# Patient Record
Sex: Female | Born: 1990 | Race: White | Hispanic: No | Marital: Married | Smoking: Never smoker
Health system: Southern US, Community
[De-identification: ages and names within clinical notes are randomized; demographics above are authoritative.]

## PROBLEM LIST (undated history)

## (undated) ENCOUNTER — Inpatient Hospital Stay (HOSPITAL_COMMUNITY): Payer: Self-pay

## (undated) DIAGNOSIS — N390 Urinary tract infection, site not specified: Secondary | ICD-10-CM

## (undated) DIAGNOSIS — Z789 Other specified health status: Secondary | ICD-10-CM

## (undated) HISTORY — PX: TONSILLECTOMY: SUR1361

---

## 2013-05-23 ENCOUNTER — Emergency Department (HOSPITAL_COMMUNITY)
Admission: EM | Admit: 2013-05-23 | Discharge: 2013-05-23 | Disposition: A | Discharge status: Home / Self Care | Payer: BC Managed Care – PPO | Attending: Emergency Medicine | Admitting: Emergency Medicine

## 2013-05-23 ENCOUNTER — Emergency Department (HOSPITAL_COMMUNITY): Payer: BC Managed Care – PPO

## 2013-05-23 ENCOUNTER — Encounter (HOSPITAL_COMMUNITY): Payer: Self-pay | Admitting: Emergency Medicine

## 2013-05-23 DIAGNOSIS — Z3202 Encounter for pregnancy test, result negative: Secondary | ICD-10-CM | POA: Insufficient documentation

## 2013-05-23 DIAGNOSIS — Z79899 Other long term (current) drug therapy: Secondary | ICD-10-CM | POA: Insufficient documentation

## 2013-05-23 DIAGNOSIS — N390 Urinary tract infection, site not specified: Secondary | ICD-10-CM | POA: Insufficient documentation

## 2013-05-23 LAB — BASIC METABOLIC PANEL
BUN: 9 mg/dL (ref 6–23)
CO2: 27 mEq/L (ref 19–32)
Calcium: 9.6 mg/dL (ref 8.4–10.5)
Chloride: 101 mEq/L (ref 96–112)
Creatinine, Ser: 0.99 mg/dL (ref 0.50–1.10)
GFR calc Af Amer: >90 mL/min (ref >90)
GFR calc non Af Amer: 79 mL/min — ABNORMAL LOW (ref >90)
Glucose, Bld: 107 mg/dL — ABNORMAL HIGH (ref 70–99)
Potassium: 3.6 mEq/L (ref 3.5–5.1)
Sodium: 136 mEq/L (ref 135–145)

## 2013-05-23 LAB — CBC WITH DIFFERENTIAL/PLATELET
Basophils Absolute: 0 10*3/uL (ref 0.0–0.1)
Basophils Relative: 1 % (ref 0–1)
Eosinophils Absolute: 0 10*3/uL (ref 0.0–0.7)
Eosinophils Relative: 1 % (ref 0–5)
HCT: 42.5 % (ref 36.0–46.0)
Hemoglobin: 14.9 g/dL (ref 12.0–15.0)
Lymphocytes Relative: 40 % (ref 12–46)
Lymphs Abs: 2.2 10*3/uL (ref 0.7–4.0)
MCH: 33.8 pg (ref 26.0–34.0)
MCHC: 35.1 g/dL (ref 30.0–36.0)
MCV: 96.4 fL (ref 78.0–100.0)
Monocytes Absolute: 0.4 10*3/uL (ref 0.1–1.0)
Monocytes Relative: 7 % (ref 3–12)
Neutro Abs: 2.9 10*3/uL (ref 1.7–7.7)
Neutrophils Relative %: 51 % (ref 43–77)
Platelets: 241 10*3/uL (ref 150–400)
RBC: 4.41 MIL/uL (ref 3.87–5.11)
RDW: 11.6 % (ref 11.5–15.5)
WBC: 5.6 10*3/uL (ref 4.0–10.5)

## 2013-05-23 LAB — URINALYSIS, ROUTINE W REFLEX MICROSCOPIC
Bilirubin Urine: NEGATIVE
Glucose, UA: NEGATIVE mg/dL
Ketones, ur: NEGATIVE mg/dL
Nitrite: NEGATIVE
Protein, ur: NEGATIVE mg/dL
Specific Gravity, Urine: >1.03 — ABNORMAL HIGH (ref 1.005–1.030)
Urobilinogen, UA: 0.2 mg/dL (ref 0.0–1.0)
pH: 6 (ref 5.0–8.0)

## 2013-05-23 LAB — URINE MICROSCOPIC-ADD ON

## 2013-05-23 LAB — PREGNANCY, URINE: Preg Test, Ur: NEGATIVE

## 2013-05-23 MED ORDER — IOHEXOL 300 MG/ML  SOLN
50.0000 mL | Freq: Once | INTRAMUSCULAR | Status: AC | PRN
  Administered 2013-05-23: 50 mL via ORAL

## 2013-05-23 MED ORDER — SODIUM CHLORIDE 0.9 % IV BOLUS (SEPSIS)
1000.0000 mL | Freq: Once | INTRAVENOUS | Status: AC
  Administered 2013-05-23: 1000 mL via INTRAVENOUS

## 2013-05-23 MED ORDER — IOHEXOL 300 MG/ML  SOLN
100.0000 mL | Freq: Once | INTRAMUSCULAR | Status: AC | PRN
  Administered 2013-05-23: 100 mL via INTRAVENOUS

## 2013-05-23 MED ORDER — OXYCODONE-ACETAMINOPHEN 5-325 MG PO TABS: 1.0000 | ORAL_TABLET | ORAL | Status: DC | PRN

## 2013-05-23 MED ORDER — PHENAZOPYRIDINE HCL 200 MG PO TABS: 200.0000 mg | ORAL_TABLET | Freq: Three times a day (TID) | ORAL | Status: DC

## 2013-05-23 MED ORDER — KETOROLAC TROMETHAMINE 30 MG/ML IJ SOLN
30.0000 mg | Freq: Once | INTRAMUSCULAR | Status: AC
  Administered 2013-05-23: 30 mg via INTRAVENOUS
  Filled 2013-05-23: qty 1

## 2013-05-23 MED ORDER — ONDANSETRON HCL 4 MG/2ML IJ SOLN
4.0000 mg | Freq: Once | INTRAMUSCULAR | Status: AC
  Administered 2013-05-23: 4 mg via INTRAVENOUS
  Filled 2013-05-23: qty 2

## 2013-05-23 MED ORDER — MORPHINE SULFATE 4 MG/ML IJ SOLN: 4.0000 mg | INTRAMUSCULAR | Status: DC | PRN

## 2013-05-23 MED ORDER — CEFTRIAXONE SODIUM 1 G IJ SOLR
1.0000 g | Freq: Once | INTRAMUSCULAR | Status: AC
  Administered 2013-05-23: 1 g via INTRAVENOUS
  Filled 2013-05-23: qty 10

## 2013-05-23 MED ORDER — CEPHALEXIN 500 MG PO CAPS: 500.0000 mg | ORAL_CAPSULE | Freq: Four times a day (QID) | ORAL | Status: DC

## 2013-05-23 MED ORDER — ONDANSETRON HCL 4 MG/2ML IJ SOLN: 4.0000 mg | Freq: Once | INTRAMUSCULAR | Status: DC

## 2013-05-23 NOTE — ED Provider Notes (Signed)
CSN: [id number]     Arrival date & time 05/23/13  4259 History   This chart was scribed for Alison Razor, MD by Bennett Scrape, ED Scribe. This patient was seen in room APA10/APA10 and the patient's care was started at 7:08 AM.   Chief Complaint  Patient presents with  . Abdominal Pain    The history is provided by the patient. No language interpreter was used.    HPI Comments: Alison Wells is a 22 y.o. female who presents to the Emergency Department complaining of RLQ described as pressure that started last night. She relates the pain to needing to urinate and also reports associated urinary uregency and decreased urinary output. She reports that the pain occasionally radiates to the right periumbilical region and is aggravated with sitting up and movement. She denies any radiation to her back. The pain is improved with rest and she denies any changes with eating. She states that she had chills yesterday that have since resolved. She denies taking any OTC medications to improve the pain. She denies any nausea, vaginal bleeding or dysuria. She denies any prior abdominal surgeries. She denies any h/o chronic medical problems.   History reviewed. No pertinent past medical history. Past Surgical History  Procedure Laterality Date  . Tonsillectomy     History reviewed. No pertinent family history. History  Substance Use Topics  . Smoking status: Never Smoker   . Smokeless tobacco: Not on file  . Alcohol Use: No   No OB history provided.  Review of Systems  Gastrointestinal: Positive for abdominal pain. Negative for nausea and vomiting.  Genitourinary: Positive for urgency and decreased urine volume. Negative for vaginal bleeding and vaginal discharge.  All other systems reviewed and are negative.    Allergies  Review of patient's allergies indicates no known allergies.-confirmed by pt at bedside  Home Medications   Current Outpatient Rx  Name  Route  Sig  Dispense  Refill  .  Levonorgestrel-Ethinyl Estradiol (CAMRESE LO) 0.1-0.02 & 0.01 MG tablet   Oral   Take 1 tablet by mouth daily.          Triage Vitals: BP 149/65  Pulse 85  Temp(Src) 98.3 F (36.8 C) (Oral)  Resp 16  Ht 5\' 7"  (1.702 m)  Wt 154 lb (69.854 kg)  BMI 24.11 kg/m2  SpO2 100%  LMP 04/18/2013  Physical Exam  Nursing note and vitals reviewed. Constitutional: She is oriented to person, place, and time. She appears well-developed and well-nourished. No distress.  HENT:  Head: Normocephalic and atraumatic.  Eyes: EOM are normal.  Neck: Neck supple. No tracheal deviation present.  Cardiovascular: Normal rate and regular rhythm.   Pulmonary/Chest: Effort normal and breath sounds normal. No respiratory distress.  Abdominal: Soft. There is tenderness (RLQ). There is guarding (voluntary). There is no rebound.  No CVA tenderness   Musculoskeletal: Normal range of motion.  Neurological: She is alert and oriented to person, place, and time.  Skin: Skin is warm and dry.  Psychiatric: She has a normal mood and affect. Her behavior is normal.    ED Course  Procedures (including critical care time)  Medications  sodium chloride 0.9 % bolus 1,000 mL (not administered)  ketorolac (TORADOL) 30 MG/ML injection 30 mg (not administered)  morphine 4 MG/ML injection 4 mg (not administered)  ondansetron (ZOFRAN) injection 4 mg (not administered)    DIAGNOSTIC STUDIES: Oxygen Saturation is 100% on room air, normal by my interpretation.    COORDINATION OF CARE: 7:09 AM-Informed  pt of possibilities of her symptoms including UTI, appendicitis or ovarian cyst. Discussed treatment plan which includes CT of abdomen, CBC panel, BMP and UA with pt at bedside and pt agreed to plan.   9:42 AM-Consult complete with Dr. Constance Goltz, radiology. Patient case explained and discussed. Dr. Constance Goltz expresses concern over possible stone on CT scan.  Labs Review Labs Reviewed  BASIC METABOLIC PANEL - Abnormal; Notable for  the following:    Glucose, Bld 107 (*)    GFR calc non Af Amer 79 (*)    All other components within normal limits  URINALYSIS, ROUTINE W REFLEX MICROSCOPIC - Abnormal; Notable for the following:    Specific Gravity, Urine >1.030 (*)    Hgb urine dipstick LARGE (*)    Leukocytes, UA SMALL (*)    All other components within normal limits  URINE MICROSCOPIC-ADD ON - Abnormal; Notable for the following:    Bacteria, UA MANY (*)    All other components within normal limits  URINE CULTURE  CBC WITH DIFFERENTIAL  PREGNANCY, URINE   Imaging Review Ct Abdomen Pelvis W Contrast  05/23/2013   CLINICAL DATA:  Right lower quadrant pain. Recent diagnosis of UTI. Question appendicitis? Normal white count. No fever.  EXAM: CT ABDOMEN AND PELVIS WITH CONTRAST  TECHNIQUE: Multidetector CT imaging of the abdomen and pelvis was performed using the standard protocol following bolus administration of intravenous contrast.  CONTRAST:  50mL OMNIPAQUE IOHEXOL 300 MG/ML SOLN, OMNIPAQUE IOHEXOL 300 MG/ML SOLN  COMPARISON:  None.  FINDINGS: Majority of the appendix is filled with contrast without evidence of extra luminal bowel inflammatory process. Overall, no bowel inflammatory process, free fluid or free air is noted.  No worrisome adnexal abnormality noted by CT. If this were of high clinical concern, pelvic sonogram would be necessary for further delineation.  In the pelvis (series 2, image 77) there is a 3 mm calcification. Question phlebolith versus distal ureteral stone. There is minimal prominence of the right renal collecting system. For further delineation to help exclude the possibility of obstructing stone, single view of the abdomen at the present time may be considered. Given the patient's age, delayed imaging through kidneys was not obtained to help evaluate for renal collecting system obstruction or subtle changes of pyelonephritis. The possibility of pyelonephritis would need to be addressed based on  clinical and laboratory findings.  No worrisome hepatic, splenic, pancreatic or adrenal abnormality. No calcified gallstones.  No abdominal aortic aneurysm. Scattered normal-sized lymph nodes. No bowel containing hernia. Lung bases clear.  L5 bilateral pars defects.  Small Schmorl's node deformity inferior endplate L1. No abnormal soft tissue abnormality. Minimal degenerative changes lower thoracic spine. Tiny bone island right femoral head.  Non contrast filled views of the urinary bladder unremarkable.  IMPRESSION: No evidence of appendicitis.  In the pelvis (series 2, image 77) there is a 3 mm calcification. Question phlebolith versus distal ureteral stone. There is minimal prominence of the right renal collecting system. For further delineation to help exclude the possibility of obstructing stone, single view of the abdomen at the present time may be considered.  L5 bilateral pars defects. Schmorl's node deformity inferior endplate L1.  Please see above.  These results were called by telephone at the time of interpretation on 05/23/2013 at 9:39 AM to Dr. Raeford Wells , who verbally acknowledged these results.   Electronically Signed   By: Bridgett Larsson M.D.   On: 05/23/2013 09:52    EKG Interpretation   None  MDM   1. UTI (urinary tract infection)    22yF with RLQ pain. UA consistent with UTI and this would go along with symptoms. Possible ureteral stone on CT. Pt afebrile. No n/v. No leukocytosis. Renal function fine. I feel she is appropriate for outpt tx at this time. Strict return precautions were discussed as this may potentially be an "infected stone." Abx, PRN pain meds. Outpt FU.   I personally preformed the services scribed in my presence. The recorded information has been reviewed is accurate. Alison Razor, MD.    Alison Razor, MD 05/26/13 1106

## 2013-05-23 NOTE — ED Notes (Signed)
Pt reporting pain in RLQ of abdomen starting last night. Reports mild urinary frequency and urgency.  Denies nausea or vomiting.

## 2013-05-23 NOTE — ED Notes (Signed)
nad noted prior to dc. Dc instructions reviewed and explained. F/u instructions reviewed. 3 scripts given.

## 2013-05-23 NOTE — Progress Notes (Signed)
ED/CM noted patient did not have health insurance and/or PCP listed in the computer.  Patient was given the [place] resource handout with information on the clinics, food pantries, and the handout for new health insurance sign-up.  Patient expressed appreciation for this. 

## 2013-05-24 LAB — URINE CULTURE: Culture: NO GROWTH

## 2014-05-11 LAB — OB RESULTS CONSOLE GC/CHLAMYDIA
Chlamydia: NEGATIVE
Gonorrhea: NEGATIVE

## 2014-05-11 LAB — OB RESULTS CONSOLE ABO/RH: RH TYPE: POSITIVE

## 2014-05-11 LAB — OB RESULTS CONSOLE RUBELLA ANTIBODY, IGM: Rubella: IMMUNE

## 2014-05-11 LAB — OB RESULTS CONSOLE HEPATITIS B SURFACE ANTIGEN: Hepatitis B Surface Ag: NEGATIVE

## 2014-05-11 LAB — OB RESULTS CONSOLE ANTIBODY SCREEN: Antibody Screen: NEGATIVE

## 2014-05-11 LAB — OB RESULTS CONSOLE HIV ANTIBODY (ROUTINE TESTING): HIV: NONREACTIVE

## 2014-05-11 LAB — OB RESULTS CONSOLE RPR: RPR: NONREACTIVE

## 2014-11-14 LAB — OB RESULTS CONSOLE GBS: GBS: NEGATIVE

## 2014-12-08 ENCOUNTER — Inpatient Hospital Stay (HOSPITAL_COMMUNITY): Payer: 59

## 2014-12-08 ENCOUNTER — Inpatient Hospital Stay (HOSPITAL_COMMUNITY)
Admission: AD | Admit: 2014-12-08 | Discharge: 2014-12-11 | DRG: 765 | Disposition: A | Discharge status: Home / Self Care | Payer: 59 | Source: Ambulatory Visit | Attending: Obstetrics & Gynecology | Admitting: Obstetrics & Gynecology

## 2014-12-08 ENCOUNTER — Encounter (HOSPITAL_COMMUNITY): Payer: Self-pay | Admitting: *Deleted

## 2014-12-08 DIAGNOSIS — Z8249 Family history of ischemic heart disease and other diseases of the circulatory system: Secondary | ICD-10-CM | POA: Diagnosis not present

## 2014-12-08 DIAGNOSIS — O368190 Decreased fetal movements, unspecified trimester, not applicable or unspecified: Secondary | ICD-10-CM

## 2014-12-08 DIAGNOSIS — R030 Elevated blood-pressure reading, without diagnosis of hypertension: Secondary | ICD-10-CM | POA: Diagnosis present

## 2014-12-08 DIAGNOSIS — O26893 Other specified pregnancy related conditions, third trimester: Principal | ICD-10-CM | POA: Diagnosis present

## 2014-12-08 DIAGNOSIS — O9081 Anemia of the puerperium: Secondary | ICD-10-CM | POA: Diagnosis not present

## 2014-12-08 DIAGNOSIS — Z3A39 39 weeks gestation of pregnancy: Secondary | ICD-10-CM | POA: Diagnosis present

## 2014-12-08 DIAGNOSIS — O2603 Excessive weight gain in pregnancy, third trimester: Secondary | ICD-10-CM | POA: Diagnosis present

## 2014-12-08 DIAGNOSIS — O3663X0 Maternal care for excessive fetal growth, third trimester, not applicable or unspecified: Secondary | ICD-10-CM | POA: Diagnosis present

## 2014-12-08 DIAGNOSIS — D62 Acute posthemorrhagic anemia: Secondary | ICD-10-CM | POA: Diagnosis not present

## 2014-12-08 DIAGNOSIS — O368130 Decreased fetal movements, third trimester, not applicable or unspecified: Secondary | ICD-10-CM | POA: Diagnosis present

## 2014-12-08 DIAGNOSIS — O324XX0 Maternal care for high head at term, not applicable or unspecified: Secondary | ICD-10-CM | POA: Diagnosis present

## 2014-12-08 DIAGNOSIS — O163 Unspecified maternal hypertension, third trimester: Secondary | ICD-10-CM

## 2014-12-08 HISTORY — DX: Other specified health status: Z78.9

## 2014-12-08 LAB — TYPE AND SCREEN
ABO/RH(D): O POS
ANTIBODY SCREEN: NEGATIVE

## 2014-12-08 LAB — COMPREHENSIVE METABOLIC PANEL
ALK PHOS: 112 U/L (ref 38–126)
ALT: 15 U/L (ref 14–54)
AST: 20 U/L (ref 15–41)
Albumin: 3.1 g/dL — ABNORMAL LOW (ref 3.5–5.0)
Anion gap: 7 (ref 5–15)
BUN: 7 mg/dL (ref 6–20)
CALCIUM: 8.7 mg/dL — AB (ref 8.9–10.3)
CO2: 21 mmol/L — AB (ref 22–32)
CREATININE: 0.61 mg/dL (ref 0.44–1.00)
Chloride: 105 mmol/L (ref 101–111)
GLUCOSE: 85 mg/dL (ref 65–99)
POTASSIUM: 3.8 mmol/L (ref 3.5–5.1)
SODIUM: 133 mmol/L — AB (ref 135–145)
Total Bilirubin: 1 mg/dL (ref 0.3–1.2)
Total Protein: 6.1 g/dL — ABNORMAL LOW (ref 6.5–8.1)

## 2014-12-08 LAB — CBC
HEMATOCRIT: 36.5 % (ref 36.0–46.0)
HEMOGLOBIN: 12.8 g/dL (ref 12.0–15.0)
MCH: 33.3 pg (ref 26.0–34.0)
MCHC: 35.1 g/dL (ref 30.0–36.0)
MCV: 95.1 fL (ref 78.0–100.0)
Platelets: 191 10*3/uL (ref 150–400)
RBC: 3.84 MIL/uL — AB (ref 3.87–5.11)
RDW: 12.8 % (ref 11.5–15.5)
WBC: 8.8 10*3/uL (ref 4.0–10.5)

## 2014-12-08 LAB — PROTEIN / CREATININE RATIO, URINE: Creatinine, Urine: 63 mg/dL

## 2014-12-08 LAB — URIC ACID: Uric Acid, Serum: 5.5 mg/dL (ref 2.3–6.6)

## 2014-12-08 LAB — ABO/RH: ABO/RH(D): O POS

## 2014-12-08 MED ORDER — OXYTOCIN 40 UNITS IN LACTATED RINGERS INFUSION - SIMPLE MED: 62.5000 mL/h | INTRAVENOUS | Status: DC

## 2014-12-08 MED ORDER — OXYCODONE-ACETAMINOPHEN 5-325 MG PO TABS: 2.0000 | ORAL_TABLET | ORAL | Status: DC | PRN

## 2014-12-08 MED ORDER — LIDOCAINE HCL (PF) 1 % IJ SOLN: 30.0000 mL | INTRAMUSCULAR | Status: DC | PRN

## 2014-12-08 MED ORDER — CITRIC ACID-SODIUM CITRATE 334-500 MG/5ML PO SOLN
30.0000 mL | ORAL | Status: DC | PRN
  Administered 2014-12-09: 30 mL via ORAL
  Filled 2014-12-08: qty 15

## 2014-12-08 MED ORDER — OXYTOCIN BOLUS FROM INFUSION: 500.0000 mL | INTRAVENOUS | Status: DC

## 2014-12-08 MED ORDER — MISOPROSTOL 25 MCG QUARTER TABLET
25.0000 ug | ORAL_TABLET | ORAL | Status: DC | PRN
  Administered 2014-12-08 – 2014-12-09 (×2): 25 ug via VAGINAL
  Filled 2014-12-08 (×2): qty 0.25

## 2014-12-08 MED ORDER — LACTATED RINGERS IV SOLN
500.0000 mL | INTRAVENOUS | Status: DC | PRN
  Administered 2014-12-09: 500 mL via INTRAVENOUS

## 2014-12-08 MED ORDER — OXYCODONE-ACETAMINOPHEN 5-325 MG PO TABS: 1.0000 | ORAL_TABLET | ORAL | Status: DC | PRN

## 2014-12-08 MED ORDER — LACTATED RINGERS IV SOLN
INTRAVENOUS | Status: DC
  Administered 2014-12-08 – 2014-12-09 (×7): via INTRAVENOUS

## 2014-12-08 MED ORDER — TERBUTALINE SULFATE 1 MG/ML IJ SOLN: 0.2500 mg | Freq: Once | INTRAMUSCULAR | Status: AC | PRN

## 2014-12-08 MED ORDER — ONDANSETRON HCL 4 MG/2ML IJ SOLN
4.0000 mg | Freq: Four times a day (QID) | INTRAMUSCULAR | Status: DC | PRN
  Administered 2014-12-09: 4 mg via INTRAVENOUS

## 2014-12-08 MED ORDER — ACETAMINOPHEN 325 MG PO TABS: 650.0000 mg | ORAL_TABLET | ORAL | Status: DC | PRN

## 2014-12-08 NOTE — MAU Provider Note (Signed)
  History     CSN: [id number]  Arrival date and time: 12/08/14 [id number]   None     Chief Complaint  Patient presents with  . Decreased Fetal Movement   HPI Comments: G1 @39 .5 wks sent from office d/t NRNST and ?decels, and dFM. Reports decreased FM x2 days. Also reports orbital HA x2 days, woke up with it this am, resolved after eating. No visual disturbances, or epigastric pain. No LOF or VB. Irregular ctx, some are uncomfortable. Pregnancy complicated by excessive weight gain, presumed LGA with EFW 9'11 at 37 wks-AFI high-normal, N/V-on Diclegis until 18 wks. Genetic screen normal.   OB History    Gravida Para Term Preterm AB TAB SAB Ectopic Multiple Living   1         0      Past Medical History  Diagnosis Date  . Medical history non-contributory     Past Surgical History  Procedure Laterality Date  . Tonsillectomy      History reviewed. No pertinent family history.  History  Substance Use Topics  . Smoking status: Never Smoker   . Smokeless tobacco: Not on file  . Alcohol Use: No    Allergies: No Known Allergies  Prescriptions prior to admission  Medication Sig Dispense Refill Last Dose  . Prenatal MV-Min-Fe Fum-FA-DHA [facility](SIMILAC PRENATAL EARLY SHIELD) 27-0.8 & 200 MG MISC Take 1 tablet by mouth at bedtime.   12/07/2014 at Unknown time  . cephALEXin (KEFLEX) 500 MG capsule Take 1 capsule (500 mg total) by mouth 4 (four) times daily. (Patient not taking: Reported on 12/08/2014) 20 capsule 0 Not Taking at Unknown time  . oxyCODONE-acetaminophen (PERCOCET/ROXICET) 5-325 MG per tablet Take 1 tablet by mouth every 4 (four) hours as needed for severe pain. (Patient not taking: Reported on 12/08/2014) 10 tablet 0   . phenazopyridine (PYRIDIUM) 200 MG tablet Take 1 tablet (200 mg total) by mouth 3 (three) times daily. (Patient not taking: Reported on 12/08/2014) 6 tablet 0 Not Taking at Unknown time    Review of Systems  Constitutional: Negative.   HENT: Negative.   Eyes:  Negative.   Respiratory: Negative.   Cardiovascular: Negative.   Gastrointestinal: Negative.   Genitourinary: Negative.   Musculoskeletal: Negative.   Skin: Negative.   Neurological: Negative.   Endo/Heme/Allergies: Negative.   Psychiatric/Behavioral: Negative.    Physical Exam   Blood pressure 133/88, pulse 95, temperature 98 F (36.7 C), temperature source Oral, resp. rate 18.  Physical Exam  Constitutional: She is oriented to person, place, and time. She appears well-developed and well-nourished.  HENT:  Head: Normocephalic and atraumatic.  Neck: Normal range of motion. Neck supple.  Cardiovascular: Normal rate and regular rhythm.   Respiratory: Effort normal and breath sounds normal.  GI: Soft. There is no tenderness.  Gravid vtx by leopolds  Genitourinary:  Deferred Cervix closed (in office)  Musculoskeletal: Normal range of motion.  Neurological: She is alert and oriented to person, place, and time.  Skin: Skin is warm and dry.  Psychiatric: She has a normal mood and affect.  EFM: 135 bpm, mod variability, +accels, no decels Toco: irregular, mild-mod  MAU Course  Procedures NST: reactive BPP/AFI: 8/8, 12.3cm PIH labs: WNL  Assessment and Plan  39.[redacted] weeks gestation Decreased fetal movement Reactive NST Gestational HTN Dr. Juliene PinaMody updated with A/P, MD to follow  Alison Wells, N 12/08/2014, 10:00 AM

## 2014-12-08 NOTE — H&P (Addendum)
Alison Wells is a 24 y.o. female G1 at 39.5 wks, sent to MAU from office for prolonged monitoring for decreased FMs since office NST was non reactive with possible decel. Pt also noted more LE swelling, some headache and nausea since yesterday, office BP was normal but urine dip with trace protein. In MAU NST was reactive, BPP 8/8 and nl AFI on sono but BPs were consistently elevated in 130-150 / 80-90s range , so decision was made for labor induction.  [facility] Henna Derderian[facility] 52 lbs. Last sono 37.5 wks EFW 9'11" at 99%.    History OB History    Gravida Para Term Preterm AB TAB SAB Ectopic Multiple Living   1         0     Past Medical History  Diagnosis Date  . Medical history non-contributory    Past Surgical History  Procedure Laterality Date  . Tonsillectomy     Family History: family history includes Diabetes in her father; Hypertension in her father and mother. Social History:  reports that she has never smoked. She has never used smokeless tobacco. She reports that she does not drink alcohol or use illicit drugs.   Prenatal Transfer Tool  Maternal Diabetes: No Genetic Screening: Normal Ultrascreen and AFP 1 normal Maternal Ultrasounds/Referrals: Normal Fetal Ultrasounds or other Referrals:  None Maternal Substance Abuse:  No Significant Maternal Medications:  None Significant Maternal Lab Results:  Lab values include: Group B Strep negative Other Comments:  None  ROS  Dilation: 1 Effacement (%): 50 Station: -2 Exam by:: Earnest ConroyNicole Licato, RN Blood pressure 147/86, pulse 90, temperature 98.9 F (37.2 C), temperature source Oral, resp. rate 18, height 5\' 7"  (1.702 m), weight 209 lb (94.802 kg). Exam Physical Exam  A&O x 3, no acute distress. Pleasant HEENT neg, no thyromegaly Lungs CTA bilat CV RRR, S1S2 normal Abdo soft, non tender, non acute Extr swelling LE +2, DTR +3/+3 Pelvic cx closed, long, stn ballotable  FHT  140/ + accels/ no decels/ mod variab-  reassuring category I Toco none   Prenatal labs: ABO, Rh: --/--/O POS, O POS (05/27 1410) Antibody: NEG (05/27 1410) Rubella: Immune (10/29 0000) RPR: Nonreactive (10/29 0000)  HBsAg: Negative (10/29 0000)  HIV: Non-reactive (10/29 0000)  GBS: Negative (05/03 0000)   Assessment/Plan: 24 yo G1 at 39.5 wks, labor IOL for elevated BPs at term. Cervix not favorable, plan cytotec per protocol. Ok to eat. GBS(-).  EFW 9'11" at 37.5 wks, reviewed increased C/section risk, prolonged labor, risk of shoulder dystocia.  Clinically there is no evidence of PEC, symptoms have resolved as well. Monitor BPs and treat above 160/100 or symptomatic.    Alison Wells R 12/08/2014, 10:05 PM

## 2014-12-08 NOTE — MAU Note (Signed)
Pt sent from MD office, was seen today for decreased FM for the past 2 days, had ? decels on NST in office.  Pt denies uc's, bleeding, or LOF.

## 2014-12-09 ENCOUNTER — Inpatient Hospital Stay (HOSPITAL_COMMUNITY): Payer: 59 | Admitting: Anesthesiology

## 2014-12-09 ENCOUNTER — Encounter (HOSPITAL_COMMUNITY): Payer: Self-pay

## 2014-12-09 ENCOUNTER — Encounter (HOSPITAL_COMMUNITY)
Admission: AD | Disposition: A | Discharge status: Home / Self Care | Payer: Self-pay | Source: Ambulatory Visit | Attending: Obstetrics & Gynecology

## 2014-12-09 LAB — COMPREHENSIVE METABOLIC PANEL
ALBUMIN: 3 g/dL — AB (ref 3.5–5.0)
ALK PHOS: 115 U/L (ref 38–126)
ALT: 14 U/L (ref 14–54)
AST: 21 U/L (ref 15–41)
Anion gap: 5 (ref 5–15)
BUN: 8 mg/dL (ref 6–20)
CO2: 23 mmol/L (ref 22–32)
Calcium: 9 mg/dL (ref 8.9–10.3)
Chloride: 108 mmol/L (ref 101–111)
Creatinine, Ser: 0.64 mg/dL (ref 0.44–1.00)
GFR calc Af Amer: >60 mL/min (ref >60)
GFR calc non Af Amer: >60 mL/min (ref >60)
Glucose, Bld: 85 mg/dL (ref 65–99)
Potassium: 4.1 mmol/L (ref 3.5–5.1)
Sodium: 136 mmol/L (ref 135–145)
TOTAL PROTEIN: 5.8 g/dL — AB (ref 6.5–8.1)
Total Bilirubin: 1.1 mg/dL (ref 0.3–1.2)

## 2014-12-09 LAB — RPR: RPR: NONREACTIVE

## 2014-12-09 LAB — CBC
HCT: 37.9 % (ref 36.0–46.0)
HEMOGLOBIN: 13.4 g/dL (ref 12.0–15.0)
MCH: 33.8 pg (ref 26.0–34.0)
MCHC: 35.4 g/dL (ref 30.0–36.0)
MCV: 95.5 fL (ref 78.0–100.0)
PLATELETS: 186 10*3/uL (ref 150–400)
RBC: 3.97 MIL/uL (ref 3.87–5.11)
RDW: 12.7 % (ref 11.5–15.5)
WBC: 10.8 10*3/uL — ABNORMAL HIGH (ref 4.0–10.5)

## 2014-12-09 LAB — URIC ACID: Uric Acid, Serum: 5.5 mg/dL (ref 2.3–6.6)

## 2014-12-09 SURGERY — Surgical Case
Anesthesia: Epidural | Site: Abdomen

## 2014-12-09 MED ORDER — NALOXONE HCL 1 MG/ML IJ SOLN
1.0000 ug/kg/h | INTRAVENOUS | Status: DC | PRN
  Filled 2014-12-09: qty 2

## 2014-12-09 MED ORDER — OXYTOCIN 40 UNITS IN LACTATED RINGERS INFUSION - SIMPLE MED: 62.5000 mL/h | INTRAVENOUS | Status: DC

## 2014-12-09 MED ORDER — DIBUCAINE 1 % RE OINT: 1.0000 "application " | TOPICAL_OINTMENT | RECTAL | Status: DC | PRN

## 2014-12-09 MED ORDER — FENTANYL 2.5 MCG/ML BUPIVACAINE 1/10 % EPIDURAL INFUSION (WH - ANES)
14.0000 mL/h | INTRAMUSCULAR | Status: DC | PRN
  Administered 2014-12-09 (×2): 14 mL/h via EPIDURAL
  Filled 2014-12-09: qty 125

## 2014-12-09 MED ORDER — NALBUPHINE HCL 10 MG/ML IJ SOLN: 5.0000 mg | Freq: Once | INTRAMUSCULAR | Status: AC | PRN

## 2014-12-09 MED ORDER — ONDANSETRON HCL 4 MG/2ML IJ SOLN
INTRAMUSCULAR | Status: AC
  Filled 2014-12-09: qty 2

## 2014-12-09 MED ORDER — OXYCODONE-ACETAMINOPHEN 5-325 MG PO TABS: 1.0000 | ORAL_TABLET | ORAL | Status: DC | PRN

## 2014-12-09 MED ORDER — CEFAZOLIN SODIUM-DEXTROSE 2-3 GM-% IV SOLR
2.0000 g | INTRAVENOUS | Status: AC
  Administered 2014-12-09: 2 g via INTRAVENOUS
  Filled 2014-12-09: qty 50

## 2014-12-09 MED ORDER — NALOXONE HCL 0.4 MG/ML IJ SOLN
0.4000 mg | INTRAMUSCULAR | Status: DC | PRN
Start: 2014-12-09 — End: 2014-12-10

## 2014-12-09 MED ORDER — WITCH HAZEL-GLYCERIN EX PADS: 1.0000 "application " | MEDICATED_PAD | CUTANEOUS | Status: DC | PRN

## 2014-12-09 MED ORDER — PHENYLEPHRINE HCL 10 MG/ML IJ SOLN
INTRAMUSCULAR | Status: DC | PRN
  Administered 2014-12-09: 80 ug via INTRAVENOUS

## 2014-12-09 MED ORDER — SCOPOLAMINE 1 MG/3DAYS TD PT72
1.0000 | MEDICATED_PATCH | Freq: Once | TRANSDERMAL | Status: DC
  Filled 2014-12-09: qty 1

## 2014-12-09 MED ORDER — DEXAMETHASONE SODIUM PHOSPHATE 10 MG/ML IJ SOLN
INTRAMUSCULAR | Status: AC
  Filled 2014-12-09: qty 1

## 2014-12-09 MED ORDER — SCOPOLAMINE 1 MG/3DAYS TD PT72
MEDICATED_PATCH | TRANSDERMAL | Status: AC
  Filled 2014-12-09: qty 1

## 2014-12-09 MED ORDER — MORPHINE SULFATE (PF) 0.5 MG/ML IJ SOLN
INTRAMUSCULAR | Status: DC | PRN
  Administered 2014-12-09: 4 mg via EPIDURAL
  Administered 2014-12-09: .5 mg via INTRAVENOUS

## 2014-12-09 MED ORDER — DIPHENHYDRAMINE HCL 50 MG/ML IJ SOLN: 12.5000 mg | INTRAMUSCULAR | Status: DC | PRN

## 2014-12-09 MED ORDER — OXYTOCIN 10 UNIT/ML IJ SOLN
INTRAMUSCULAR | Status: AC
  Filled 2014-12-09: qty 4

## 2014-12-09 MED ORDER — KETOROLAC TROMETHAMINE 30 MG/ML IJ SOLN: 30.0000 mg | Freq: Four times a day (QID) | INTRAMUSCULAR | Status: DC | PRN

## 2014-12-09 MED ORDER — IBUPROFEN 600 MG PO TABS
600.0000 mg | ORAL_TABLET | Freq: Four times a day (QID) | ORAL | Status: DC
  Administered 2014-12-10 – 2014-12-11 (×6): 600 mg via ORAL
  Filled 2014-12-09 (×5): qty 1

## 2014-12-09 MED ORDER — MEPERIDINE HCL 25 MG/ML IJ SOLN
INTRAMUSCULAR | Status: AC
  Filled 2014-12-09: qty 1

## 2014-12-09 MED ORDER — LIDOCAINE HCL (PF) 1 % IJ SOLN
INTRAMUSCULAR | Status: DC | PRN
  Administered 2014-12-09 (×2): 4 mL

## 2014-12-09 MED ORDER — MEPERIDINE HCL 25 MG/ML IJ SOLN
INTRAMUSCULAR | Status: DC | PRN
  Administered 2014-12-09 (×2): 12.5 mg via INTRAVENOUS

## 2014-12-09 MED ORDER — NALBUPHINE HCL 10 MG/ML IJ SOLN: 5.0000 mg | INTRAMUSCULAR | Status: DC | PRN

## 2014-12-09 MED ORDER — LANOLIN HYDROUS EX OINT: 1.0000 "application " | TOPICAL_OINTMENT | CUTANEOUS | Status: DC | PRN

## 2014-12-09 MED ORDER — BUTORPHANOL TARTRATE 1 MG/ML IJ SOLN
1.0000 mg | Freq: Once | INTRAMUSCULAR | Status: AC
  Administered 2014-12-09: 1 mg via INTRAVENOUS
  Filled 2014-12-09: qty 1

## 2014-12-09 MED ORDER — OXYTOCIN 40 UNITS IN LACTATED RINGERS INFUSION - SIMPLE MED
1.0000 m[IU]/min | INTRAVENOUS | Status: DC
  Administered 2014-12-09: 2 m[IU]/min via INTRAVENOUS
  Filled 2014-12-09: qty 1000

## 2014-12-09 MED ORDER — OXYCODONE-ACETAMINOPHEN 5-325 MG PO TABS: 2.0000 | ORAL_TABLET | ORAL | Status: DC | PRN

## 2014-12-09 MED ORDER — TERBUTALINE SULFATE 1 MG/ML IJ SOLN: 0.2500 mg | Freq: Once | INTRAMUSCULAR | Status: DC | PRN

## 2014-12-09 MED ORDER — SCOPOLAMINE 1 MG/3DAYS TD PT72
MEDICATED_PATCH | TRANSDERMAL | Status: DC | PRN
  Administered 2014-12-09: 1 via TRANSDERMAL

## 2014-12-09 MED ORDER — SIMETHICONE 80 MG PO CHEW
80.0000 mg | CHEWABLE_TABLET | ORAL | Status: DC | PRN
  Administered 2014-12-10: 80 mg via ORAL

## 2014-12-09 MED ORDER — SENNOSIDES-DOCUSATE SODIUM 8.6-50 MG PO TABS
2.0000 | ORAL_TABLET | ORAL | Status: DC
  Administered 2014-12-10 – 2014-12-11 (×2): 2 via ORAL
  Filled 2014-12-09 (×2): qty 2

## 2014-12-09 MED ORDER — FENTANYL CITRATE (PF) 100 MCG/2ML IJ SOLN: 25.0000 ug | INTRAMUSCULAR | Status: DC | PRN

## 2014-12-09 MED ORDER — DIPHENHYDRAMINE HCL 25 MG PO CAPS: 25.0000 mg | ORAL_CAPSULE | Freq: Four times a day (QID) | ORAL | Status: DC | PRN

## 2014-12-09 MED ORDER — LACTATED RINGERS IV SOLN
INTRAVENOUS | Status: DC
  Administered 2014-12-10: 03:00:00 via INTRAVENOUS

## 2014-12-09 MED ORDER — ACETAMINOPHEN 325 MG PO TABS
650.0000 mg | ORAL_TABLET | ORAL | Status: DC | PRN
Start: 2014-12-09 — End: 2014-12-11

## 2014-12-09 MED ORDER — SIMETHICONE 80 MG PO CHEW
80.0000 mg | CHEWABLE_TABLET | Freq: Three times a day (TID) | ORAL | Status: DC
  Administered 2014-12-10 – 2014-12-11 (×2): 80 mg via ORAL
  Filled 2014-12-09 (×3): qty 1

## 2014-12-09 MED ORDER — MORPHINE SULFATE 0.5 MG/ML IJ SOLN
INTRAMUSCULAR | Status: AC
  Filled 2014-12-09: qty 10

## 2014-12-09 MED ORDER — DEXAMETHASONE SODIUM PHOSPHATE 10 MG/ML IJ SOLN
INTRAMUSCULAR | Status: DC | PRN
  Administered 2014-12-09: 5 mg via INTRAVENOUS

## 2014-12-09 MED ORDER — METOCLOPRAMIDE HCL 5 MG/ML IJ SOLN
INTRAMUSCULAR | Status: AC
Start: 2014-12-09 — End: 2014-12-09
  Filled 2014-12-09: qty 2

## 2014-12-09 MED ORDER — ZOLPIDEM TARTRATE 5 MG PO TABS: 5.0000 mg | ORAL_TABLET | Freq: Every evening | ORAL | Status: DC | PRN

## 2014-12-09 MED ORDER — SODIUM CHLORIDE 0.9 % IJ SOLN: 3.0000 mL | INTRAMUSCULAR | Status: DC | PRN

## 2014-12-09 MED ORDER — ONDANSETRON HCL 4 MG/2ML IJ SOLN: 4.0000 mg | Freq: Once | INTRAMUSCULAR | Status: DC | PRN

## 2014-12-09 MED ORDER — METOCLOPRAMIDE HCL 5 MG/ML IJ SOLN
INTRAMUSCULAR | Status: DC | PRN
  Administered 2014-12-09: 10 mg via INTRAVENOUS

## 2014-12-09 MED ORDER — EPHEDRINE 5 MG/ML INJ: 10.0000 mg | INTRAVENOUS | Status: DC | PRN

## 2014-12-09 MED ORDER — ACETAMINOPHEN 10 MG/ML IV SOLN
1000.0000 mg | Freq: Once | INTRAVENOUS | Status: AC
  Administered 2014-12-09: 1000 mg via INTRAVENOUS
  Filled 2014-12-09: qty 100

## 2014-12-09 MED ORDER — DIPHENHYDRAMINE HCL 25 MG PO CAPS: 25.0000 mg | ORAL_CAPSULE | ORAL | Status: DC | PRN

## 2014-12-09 MED ORDER — SIMETHICONE 80 MG PO CHEW
80.0000 mg | CHEWABLE_TABLET | ORAL | Status: DC
  Administered 2014-12-10 – 2014-12-11 (×2): 80 mg via ORAL
  Filled 2014-12-09 (×2): qty 1

## 2014-12-09 MED ORDER — LACTATED RINGERS IV SOLN
INTRAVENOUS | Status: DC | PRN
  Administered 2014-12-09: 17:00:00 via INTRAVENOUS

## 2014-12-09 MED ORDER — MENTHOL 3 MG MT LOZG: 1.0000 | LOZENGE | OROMUCOSAL | Status: DC | PRN

## 2014-12-09 MED ORDER — CEFAZOLIN SODIUM-DEXTROSE 2-3 GM-% IV SOLR
INTRAVENOUS | Status: AC
  Filled 2014-12-09: qty 50

## 2014-12-09 MED ORDER — MEPERIDINE HCL 25 MG/ML IJ SOLN
6.2500 mg | INTRAMUSCULAR | Status: DC | PRN
Start: 2014-12-09 — End: 2014-12-09

## 2014-12-09 MED ORDER — PHENYLEPHRINE 40 MCG/ML (10ML) SYRINGE FOR IV PUSH (FOR BLOOD PRESSURE SUPPORT)
80.0000 ug | PREFILLED_SYRINGE | INTRAVENOUS | Status: DC | PRN
  Filled 2014-12-09: qty 20

## 2014-12-09 MED ORDER — PHENYLEPHRINE 40 MCG/ML (10ML) SYRINGE FOR IV PUSH (FOR BLOOD PRESSURE SUPPORT)
PREFILLED_SYRINGE | INTRAVENOUS | Status: AC
  Filled 2014-12-09: qty 10

## 2014-12-09 MED ORDER — OXYTOCIN 10 UNIT/ML IJ SOLN
40.0000 [IU] | INTRAVENOUS | Status: DC | PRN
  Administered 2014-12-09: 40 [IU] via INTRAVENOUS

## 2014-12-09 MED ORDER — SODIUM BICARBONATE 8.4 % IV SOLN
INTRAVENOUS | Status: DC | PRN
  Administered 2014-12-09 (×3): 5 mL via EPIDURAL

## 2014-12-09 MED ORDER — PRENATAL MULTIVITAMIN CH
1.0000 | ORAL_TABLET | Freq: Every day | ORAL | Status: DC
  Administered 2014-12-10: 1 via ORAL
  Filled 2014-12-09: qty 1

## 2014-12-09 MED ORDER — ONDANSETRON HCL 4 MG/2ML IJ SOLN: 4.0000 mg | Freq: Three times a day (TID) | INTRAMUSCULAR | Status: DC | PRN

## 2014-12-09 SURGICAL SUPPLY — 39 items
BENZOIN TINCTURE PRP APPL 2/3 (GAUZE/BANDAGES/DRESSINGS) ×3 IMPLANT
CLAMP CORD UMBIL (MISCELLANEOUS) IMPLANT
CLOSURE WOUND 1/2 X4 (GAUZE/BANDAGES/DRESSINGS) ×1
CLOTH BEACON ORANGE TIMEOUT ST (SAFETY) ×3 IMPLANT
CONTAINER PREFILL 10% NBF 15ML (MISCELLANEOUS) IMPLANT
DRAPE SHEET LG 3/4 BI-LAMINATE (DRAPES) IMPLANT
DRSG OPSITE POSTOP 4X10 (GAUZE/BANDAGES/DRESSINGS) ×3 IMPLANT
DURAPREP 26ML APPLICATOR (WOUND CARE) ×3 IMPLANT
ELECT REM PT RETURN 9FT ADLT (ELECTROSURGICAL) ×3
ELECTRODE REM PT RTRN 9FT ADLT (ELECTROSURGICAL) ×1 IMPLANT
EXTRACTOR VACUUM KIWI (MISCELLANEOUS) IMPLANT
EXTRACTOR VACUUM M CUP 4 TUBE (SUCTIONS) IMPLANT
EXTRACTOR VACUUM M CUP 4' TUBE (SUCTIONS)
GLOVE BIO SURGEON STRL SZ7 (GLOVE) ×3 IMPLANT
GLOVE BIOGEL PI IND STRL 7.0 (GLOVE) ×4 IMPLANT
GLOVE BIOGEL PI INDICATOR 7.0 (GLOVE) ×8
GLOVE ECLIPSE 6.5 STRL STRAW (GLOVE) ×3 IMPLANT
GLOVE ECLIPSE 7.0 STRL STRAW (GLOVE) ×3 IMPLANT
GOWN STRL REUS W/TWL LRG LVL3 (GOWN DISPOSABLE) ×9 IMPLANT
KIT ABG SYR 3ML LUER SLIP (SYRINGE) IMPLANT
NEEDLE HYPO 25X5/8 SAFETYGLIDE (NEEDLE) IMPLANT
NS IRRIG 1000ML POUR BTL (IV SOLUTION) ×3 IMPLANT
PACK C SECTION WH (CUSTOM PROCEDURE TRAY) ×3 IMPLANT
PAD OB MATERNITY 4.3X12.25 (PERSONAL CARE ITEMS) ×3 IMPLANT
RTRCTR C-SECT PINK 25CM LRG (MISCELLANEOUS) ×3 IMPLANT
STAPLER VISISTAT 35W (STAPLE) IMPLANT
STRIP CLOSURE SKIN 1/2X4 (GAUZE/BANDAGES/DRESSINGS) ×2 IMPLANT
SUT MON AB-0 CT1 36 (SUTURE) ×9 IMPLANT
SUT PLAIN 0 NONE (SUTURE) IMPLANT
SUT PLAIN 2 0 (SUTURE) ×2
SUT PLAIN ABS 2-0 CT1 27XMFL (SUTURE) ×1 IMPLANT
SUT VIC AB 0 CT1 27 (SUTURE) ×4
SUT VIC AB 0 CT1 27XBRD ANBCTR (SUTURE) ×2 IMPLANT
SUT VIC AB 2-0 CT1 27 (SUTURE) ×6
SUT VIC AB 2-0 CT1 TAPERPNT 27 (SUTURE) ×3 IMPLANT
SUT VIC AB 4-0 KS 27 (SUTURE) ×3 IMPLANT
SUT VICRYL 0 TIES 12 18 (SUTURE) IMPLANT
TOWEL OR 17X24 6PK STRL BLUE (TOWEL DISPOSABLE) ×3 IMPLANT
TRAY FOLEY CATH SILVER 14FR (SET/KITS/TRAYS/PACK) IMPLANT

## 2014-12-09 NOTE — Transfer of Care (Signed)
Immediate Anesthesia Transfer of Care Note  Patient: Alison Wells  Procedure(s) Performed: Procedure(s): CESAREAN SECTION (N/A)  Patient Location: PACU  Anesthesia Type:Epidural  Level of Consciousness: awake, alert  and oriented  Airway & Oxygen Therapy: Patient Spontanous Breathing  Post-op Assessment: Report given to RN and Post -op Vital signs reviewed and stable  Post vital signs: Reviewed and stable  Last Vitals:  Filed Vitals:   12/09/14 1730  BP:   Pulse: 102  Temp: 37.6 C  Resp:     Complications: No apparent anesthesia complications

## 2014-12-09 NOTE — Op Note (Signed)
Cesarean Section Procedure Note Alison Wells Kroboth  12/09/2014  Indications: Failure to decent, suspected macrosomia   Pre-operative Diagnosis: primary cesarean section, failure to progress/descent, suspected macrosomia. 39.6 wks. Elevated blood pressure  Post-operative Diagnosis: Same   Surgeon:  Shea EvansVaishali Golden Emile, MD  Assistants: Marlinda Mikeanya Bailey, CNM  Anesthesia: Epidural   Procedure Details:  The patient was seen in the Labor. She was in protracted latent phase but no decent noted and head remained at -3-2 station with a small caput. Decision was made to proceed with C-section since macrosomia was suspected. The risks, benefits, complications, treatment options, and expected outcomes were discussed with the patient. The patient concurred with the proposed plan, giving informed consent. identified as Alison Wells Scheurich and the procedure verified as C-Section Delivery. A Time Out was held and the above information confirmed.  2 gm Ancef given. After induction of anesthesia, the patient was draped and prepped in the usual sterile manner. A Pfannenstiel Incision was made and carried down through the subcutaneous tissue to the fascia. Fascial incision was made and extended transversely. The fascia was separated from the underlying rectus tissue superiorly and inferiorly. The peritoneum was identified and entered. Peritoneal incision was extended longitudinally. Alexis-O retractor was placed. The utero-vesical peritoneal reflection was incised transversely and the bladder flap was bluntly freed from the lower uterine segment. A low transverse uterine incision was made. Baby was in ROT position. Female infant was delivered by rotation and flexion of the head and rest of the baby was delivered carefully, birth time 4.41 pm. Cord clamped and cut and healthy vigorous baby BOY was handed to NICU team in attendance. Apgar scores of 8 at one minute and 9 at five minutes. Cord ph was not sent, cord blood was obtained for evaluation.  Active bleeding sites on uterine incision were clamped with Ring forceps. Uterine atony noted and pitocin massage improved the tone. The placenta was removed Intact and appeared normal. The uterine outline, tubes and ovaries appeared normal. The uterine incision was closed with running locked sutures of 0Monocryl followed by a second imbricating layer. Hemostasis was observed. Alexis retractor was removed. Parietal peritoneum was closed with 2-0 Vicryl. Pyramidalis approximated in the midline.  The fascia was then reapproximated with running sutures of 0Vicryl. The subcuticular closure was performed using 2-0plain gut. The skin was closed with 4-0Vicryl. Steristrips and sterile dressing placed.   Instrument, sponge, and needle counts were correct prior the abdominal closure and were correct at the conclusion of the case.   Findings: Female infant delivered at 4.41 pm on 12/09/14 from ROT cephalic position Apgars 8 and 9 at 1 and 5 minutes. Placenta normal, cord 3vessel, normal. [facility] incision but no extensions noted. Normal tubes and ovaries.    Estimated Blood Loss: 1100 cc   Total IV Fluids:  2400 ml LR  Urine Output: 75CC OF clear urine  Specimens: Cord blood   Complications: no complications  Disposition: PACU - hemodynamically stable.   Maternal Condition: stable   Baby condition / location:  Couplet care / Skin to Skin  Attending Attestation: I performed the procedure.   Signed: Surgeon(s): Shea EvansVaishali Delonta Yohannes, MD

## 2014-12-09 NOTE — Anesthesia Procedure Notes (Signed)
Epidural Patient location during procedure: OB  Staffing Performed by: anesthesiologist   Preanesthetic Checklist Completed: patient identified, site marked, surgical consent, pre-op evaluation, timeout performed, IV checked, risks and benefits discussed and monitors and equipment checked  Epidural Patient position: sitting Prep: site prepped and draped and DuraPrep Patient monitoring: continuous pulse ox and blood pressure Approach: midline Location: L3-L4 Injection technique: LOR saline  Needle:  Needle type: Tuohy  Needle gauge: 17 G Needle length: 9 cm and 9 Needle insertion depth: 6 cm Catheter type: closed end flexible Catheter size: 19 Gauge Catheter at skin depth: 10 cm Test dose: negative  Assessment Events: blood not aspirated, injection not painful, no injection resistance, negative IV test and no paresthesia

## 2014-12-09 NOTE — Progress Notes (Signed)
Alison Wells is a 24 y.o.  Comfortable with epidural boluses BP 153/87 mmHg  Pulse 97  Temp(Src) 99 F (37.2 C) (Oral)  Resp 20  Ht 5\' 7"  (1.702 m)  Wt 209 lb (94.802 kg)  BMI 32.73 kg/m2  SpO2 98%  FHT 145/ category I Toco 3 min SVE 3/80%/-3 with small caput noted  AM labs normal   Assessment / Plan: Arrest of decent, suspected macrosomia. Proceed with C-section.  Risks/complications of surgery reviewed incl infection, bleeding, damage to internal organs including bladder, bowels, ureters, blood vessels, other risks from anesthesia, VTE and delayed complications of any surgery, complications in future surgery reviewed. Also discussed neonatal complications incl difficult delivery, laceration, vacuum assistance, TTN etc. Pt understands and agrees, all concerns addressed.     Alison Wells R 12/09/2014, 4:10 PM

## 2014-12-09 NOTE — Anesthesia Preprocedure Evaluation (Signed)
Anesthesia Evaluation  Patient identified by MRN, date of birth, ID band Patient awake    Reviewed: Allergy & Precautions, NPO status , Patient's Chart, lab work & pertinent test results  History of Anesthesia Complications Negative for: history of anesthetic complications  Airway Mallampati: II  TM Distance: >3 FB Neck ROM: Full    Dental no notable dental hx. (+) Dental Advisory Given   Pulmonary neg pulmonary ROS,  breath sounds clear to auscultation  Pulmonary exam normal       Cardiovascular hypertension, Normal cardiovascular examRhythm:Regular Rate:Normal     Neuro/Psych negative neurological ROS  negative psych ROS   GI/Hepatic negative GI ROS, Neg liver ROS,   Endo/Other  obesity  Renal/GU negative Renal ROS  negative genitourinary   Musculoskeletal negative musculoskeletal ROS (+)   Abdominal   Peds negative pediatric ROS (+)  Hematology negative hematology ROS (+)   Anesthesia Other Findings   Reproductive/Obstetrics (+) Pregnancy                             Anesthesia Physical Anesthesia Plan  ASA: III  Anesthesia Plan: Epidural   Post-op Pain Management:    Induction:   Airway Management Planned:   Additional Equipment:   Intra-op Plan:   Post-operative Plan:   Informed Consent: I have reviewed the patients History and Physical, chart, labs and discussed the procedure including the risks, benefits and alternatives for the proposed anesthesia with the patient or authorized representative who has indicated his/her understanding and acceptance.   Dental advisory given  Plan Discussed with:   Anesthesia Plan Comments:         Anesthesia Quick Evaluation

## 2014-12-09 NOTE — Progress Notes (Signed)
Gennette PacKayla Gras is a 24 y.o. G1P0 at 8473w6d by LMP and 1st trim sono. Admitted for IOL due to decreased FMs and elevated BPs. Fetal testing was reactive/ BPP 8/8 and since admission is it in category I.  Overnight contractions continued with 1st Cytotec dose, so more doses were deferred. This morning after shower and breakfast, UCs have spaced out and was started on pitocin.  Pt denies HA/ vision changes/ nausea that was noted for 2 days before admission.   Objective: BP 131/78 mmHg  Pulse 71  Temp(Src) 98.6 F (37 C) (Oral)  Resp 18  Ht 5\' 7"  (1.702 m)  Wt 209 lb (94.802 kg)  BMI 32.73 kg/m2  BPs more stable now. No antiHTN meds needed.  FHT:  FHR: 145 bpm, variability: moderate,  accelerations:  Present,  decelerations:  Absent UC:   regular, every 2-3 minutes SVE:   2/70%/-4-5 Ballotable, AROM, clear fluid, blood show noted. Borderline pelvis   Assessment / Plan: G1 at 39.6 wks, labor IOL for elevated BPs but not preeclampsia. No symptoms. EFW 9'11" at 37.5 wks sono, so anticipate at least 9-9.1/2 lb today with borderline pelvis and high station. Patient will continue labor trial with careful monitoring of progress especially flexion/decent of head. She was counseled on very high likelihood of C/section due to fetal size and maternal pelvis and she voices understanding and agrees. She is very anxious about delivering a 10lb baby.  BPs are stable, repeat CBC, CMP this morning for platelets epidural.   Setsuko Robins R 12/09/2014, 9:11 AM

## 2014-12-09 NOTE — Anesthesia Postprocedure Evaluation (Signed)
  Anesthesia Post-op Note  Patient: Alison Wells  Procedure(s) Performed: Procedure(s) (LRB): CESAREAN SECTION (N/A)  Patient Location: PACU  Anesthesia Type: epidural  Level of Consciousness: awake and alert   Airway and Oxygen Therapy: Patient Spontanous Breathing  Post-op Pain: mild  Post-op Assessment: Post-op Vital signs reviewed, Patient's Cardiovascular Status Stable, Respiratory Function Stable, Patent Airway and No signs of Nausea or vomiting  Last Vitals:  Filed Vitals:   12/09/14 1800  BP: 123/75  Pulse: 101  Temp:   Resp: 23    Post-op Vital Signs: stable   Complications: No apparent anesthesia complications

## 2014-12-10 LAB — CBC
HCT: 29.5 % — ABNORMAL LOW (ref 36.0–46.0)
Hemoglobin: 10.4 g/dL — ABNORMAL LOW (ref 12.0–15.0)
MCH: 33.2 pg (ref 26.0–34.0)
MCHC: 34.9 g/dL (ref 30.0–36.0)
MCV: 95.2 fL (ref 78.0–100.0)
Platelets: 173 10*3/uL (ref 150–400)
RBC: 3.1 MIL/uL — AB (ref 3.87–5.11)
RDW: 12.6 % (ref 11.5–15.5)
WBC: 14.5 10*3/uL — ABNORMAL HIGH (ref 4.0–10.5)

## 2014-12-10 NOTE — Lactation Note (Signed)
This note was copied from the chart of Alison Wells President. Lactation Consultation Note  Patient Name: Alison Wells [place] ZOXWR'UToday's Date: 12/10/2014 Reason for consult: Initial assessment Baby was nursing on left breast using #20 nipple shield when LC arrived. Baby demonstrating a good rhythmic suck, colostrum in the nipple shield when baby came off the breast. Baby cluster feeding, Mom reports he has been at the breast for about 50 minutes. Mom's left nipple has small blood blister, care for sore nipples reviewed, comfort gels given with instructions. Mom had pumped approx 5 ml of EBM earlier, demonstrated and FOB demonstrated back how to finger feed this using curved tipped syringe. Advised Mom baby should be at the breast 8-12 times in 24 hours and with feeding ques. Reviewed breast milk storage and advised with pumping to give baby back any amount of EBM she receives. Mom to post pump when she can to encourage milk production. Advised Mom to consider scheduling OP f/u since she is using a nipple shield. Mom will advise. Encouraged to call for assist as needed.   Maternal Data Has patient been taught Hand Expression?: Yes Does the patient have breastfeeding experience prior to this delivery?: No  Feeding Feeding Type: Breast Milk Length of feed: 50 min  LATCH Score/Interventions Latch: Repeated attempts needed to sustain latch, nipple held in mouth throughout feeding, stimulation needed to elicit sucking reflex. Intervention(s): Skin to skin;Teach feeding cues;Waking techniques Intervention(s): Adjust position;Assist with latch;Breast massage;Breast compression  Audible Swallowing: A few with stimulation Intervention(s): Skin to skin;Hand expression Intervention(s): Alternate breast massage  Type of Nipple: Flat Intervention(s): Double electric pump  Comfort (Breast/Nipple): Filling, red/small blisters or bruises, mild/mod discomfort  Problem noted: Mild/Moderate discomfort;Cracked,  bleeding, blisters, bruises Interventions  (Cracked/bleeding/bruising/blister): Expressed breast milk to nipple Interventions (Mild/moderate discomfort): Comfort gels  Hold (Positioning): Assistance needed to correctly position infant at breast and maintain latch. Intervention(s): Breastfeeding basics reviewed;Support Pillows;Position options;Skin to skin  LATCH Score: 7  Lactation Tools Discussed/Used Tools: Pump;Comfort gels Nipple shield size: 20 Breast pump type: Double-Electric Breast Pump   Consult Status Consult Status: Follow-up Date: 12/10/14 Follow-up type: In-patient    Alfred LevinsGranger, Lunabella Badgett Ann 12/10/2014, 5:48 PM

## 2014-12-10 NOTE — Anesthesia Postprocedure Evaluation (Signed)
Anesthesia Post Note  Patient: Alison Wells  Procedure(s) Performed: Procedure(s) (LRB): CESAREAN SECTION (N/A)  Anesthesia type: Epidural  Patient location: Mother/Baby  Post pain: Pain level controlled  Post assessment: Post-op Vital signs reviewed  Last Vitals:  Filed Vitals:   12/10/14 0515  BP: 117/68  Pulse: 90  Temp: 36.7 C  Resp: 16    Post vital signs: Reviewed  Level of consciousness:alert  Complications: No apparent anesthesia complications

## 2014-12-10 NOTE — Addendum Note (Signed)
Addendum  created 12/10/14 0915 by Algis GreenhouseLinda A Olivya Sobol, CRNA   Modules edited: Notes Section   Notes Section:  File: [id number]

## 2014-12-10 NOTE — Progress Notes (Signed)
POSTOPERATIVE DAY # 1 S/P CS - arrest of descent/ macrosomia  S:         Reports feeling ok - tired & itching             Tolerating po intake / no nausea / no vomiting / no flatus / no BM             Bleeding is light             Pain controlled with long-acting narcotic             Up ad lib / not voided yet - catheter out this am  Newborn breast feeding  / Circumcision this am  O:  VS: BP 117/68 mmHg  Pulse 90  Temp(Src) 98.1 F (36.7 C) (Oral)  Resp 16  Ht 5\' 7"  (1.702 m)  Wt 94.802 kg (209 lb)  BMI 32.73 kg/m2  SpO2 93%  Breastfeeding? Unknown   LABS:               Recent Labs  12/09/14 1056 12/10/14 0609  WBC 10.8* 14.5*  HGB 13.4 10.4*  PLT 186 173               Bloodtype: --/--/O POS, O POS (05/27 1410)  Rubella: Immune (10/29 0000)                                             I&O: Intake/Output      05/28 0701 - 05/29 0700 05/29 0701 - 05/30 0700   P.O. 240    I.V. (mL/kg) 2934 (30.9)    Total Intake(mL/kg) 3174 (33.5)    Urine (mL/kg/hr) 825 (0.4)    Blood 1100 (0.5)    Total Output 1925     Net +1249                       Physical Exam:             Alert and Oriented X3  Lungs: Clear and unlabored  Heart: regular rate and rhythm / no mumurs  Abdomen: soft, non-tender, mildly distended, active BS             Fundus: firm, non-tender, Ueven             Dressing intact honeycomb              Incision:  approximated with suture / no erythema / no ecchymosis / no drainage  Perineum: intact  Lochia: light  Extremities: trace edema, no calf pain or tenderness, SCD in place  A:        POD # 1 S/P CS - arrest of descent            ABL anemia - stable  P:        Routine postoperative care              Ambulate / shower today             Attempt to void every 1-2 hours prior to strong urge to avoid bladder distention             WATER intake      Marlinda MikeBAILEY, Anishka Bushard CNM, MSN, [facility] 12/10/2014, 9:33 AM

## 2014-12-11 ENCOUNTER — Encounter (HOSPITAL_COMMUNITY): Payer: Self-pay | Admitting: Obstetrics & Gynecology

## 2014-12-11 LAB — BIRTH TISSUE RECOVERY COLLECTION (PLACENTA DONATION)

## 2014-12-11 MED ORDER — OXYCODONE-ACETAMINOPHEN 5-325 MG PO TABS: 1.0000 | ORAL_TABLET | ORAL | Status: DC | PRN

## 2014-12-11 MED ORDER — IBUPROFEN 600 MG PO TABS: 600.0000 mg | ORAL_TABLET | Freq: Four times a day (QID) | ORAL | Status: DC

## 2014-12-11 NOTE — Progress Notes (Signed)
POSTOPERATIVE DAY # 2 S/P CS - failure to descend / macrosomia   S:         Reports feeling well - mostly sore in abdomen             Tolerating po intake / no nausea / no vomiting / + flatus / not yet but feels like she could have BM             Bleeding is light             Pain controlled withmotrin and percocet             Up ad lib / ambulatory/ voiding QS  Newborn breast feeding   O:  VS: BP 128/79 mmHg  Pulse 85  Temp(Src) 97.7 F (36.5 C) (Oral)  Resp 18  Ht 5\' 7"  (1.702 m)  Wt 94.802 kg (209 lb)  BMI 32.73 kg/m2  SpO2 98%  Breastfeeding? Unknown   LABS:               Recent Labs  12/09/14 1056 12/10/14 0609  WBC 10.8* 14.5*  HGB 13.4 10.4*  PLT 186 173               Bloodtype: --/--/O POS, O POS (05/27 1410)  Rubella: Immune (10/29 0000)                                             I&O: Intake/Output      05/29 0701 - 05/30 0700 05/30 0701 - 05/31 0700   P.O.     I.V. (mL/kg)     Total Intake(mL/kg)     Urine (mL/kg/hr) 800 (0.4)    Blood     Total Output 800     Net -800                       Physical Exam:             Alert and Oriented X3  Abdomen: soft, non-tender, non-distended active BS             Fundus: firm, non-tender, Ueven             Dressing intact honeycomb              Incision:  approximated with suture / no erythema / no ecchymosis / no drainage  Extremities: trace edema, no calf pain or tenderness, negative Homans  A:        POD # 2 S/P CS            Stable status for early DC  P:        Routine postoperative care              WOB booklet - instructions reviewed               Dc today   Marlinda MikeBAILEY, Kareemah Grounds CNM, MSN, FACNM 12/11/2014, 10:07 AM

## 2014-12-11 NOTE — Discharge Summary (Signed)
  POSTOPERATIVE DISCHARGE SUMMARY:  Patient ID: Alison Wells MRN: [id number] DOB/AGE: February 07, 1991 24 y.o.  Admit date: 12/08/2014 Admission Diagnoses: 39.6 weeks / suspected macrosomia / elevated blood pressure / decreased FM  Discharge date:  12/11/2014 Discharge Diagnoses: POD 2 s/p cesarean section for arrest of descent / macrosomia  Prenatal history: G1P1001   EDC : 12/10/2014, Alternate EDD Entry  [facility]  Primary provider : Mody Prenatal course complicated by excessive maternal weight gain / size>dates with suspected macrosomia / elevated BP  Prenatal Labs: ABO, Rh: --/--/O POS, O POS (05/27 1410)  Antibody: NEG (05/27 1410) Rubella: Immune (10/29 0000)  RPR: Non Reactive (05/27 1410)  HBsAg: Negative (10/29 0000)  HIV: Non-reactive (10/29 0000)  GTT : NL GBS: Negative (05/03 0000)   Medical / Surgical History :  Past medical history:  Past Medical History  Diagnosis Date  . Medical history non-contributory     Past surgical history:  Past Surgical History  Procedure Laterality Date  . Tonsillectomy      Family History:  Family History  Problem Relation Age of Onset  . Hypertension Mother   . Diabetes Father   . Hypertension Father     Social History:  reports that she has never smoked. She has never used smokeless tobacco. She reports that she does not drink alcohol or use illicit drugs.  Allergies: Review of patient's allergies indicates no known allergies.   Current Medications at time of admission:  Prenatal daily  Intrapartum Course:  Admit for induction of labor due to decreased FM and questionable FHR decel on NST / elevated BP without evidence of PEC         labor progression to 4cm dilation with protracted labor curve without any fetal descent Pain management: epidural Complicated by: arrest of labor and descent with suspected macrosomia (EFW ~10 pounds) Interventions required: cesarean section -  primary  Procedures: Cesarean section delivery on 12/09/2014 with delivery of female newborn by Dr Juliene PinaMody   See operative report for further details APGAR (1 MIN): 8   APGAR (5 MINS): 9    Postoperative / postpartum course:  Uncomplicated with discharge on POD 2  Discharge Instructions:  Discharged Condition: stable  Activity: pelvic rest and postoperative restrictions x 2   Diet: routine  Medications:    Medication List    TAKE these medications        ibuprofen 600 MG tablet  Commonly known as:  ADVIL,MOTRIN  Take 1 tablet (600 mg total) by mouth every 6 (six) hours.     oxyCODONE-acetaminophen 5-325 MG per tablet  Commonly known as:  PERCOCET/ROXICET  Take 1 tablet by mouth every 4 (four) hours as needed (for pain scale 4-7).        Wound Care: keep clean and dry / remove honeycomb POD 1-2 days / steri-strips off in 2 weeks Postpartum Instructions: [facility] discharge booklet - instructions reviewed  Discharge to: Home  Follow up :  [facility] in 6 weeks for routine postpartum visit with Dr Juliene PinaMody                Signed: Marlinda MikeBAILEY, Leonidus Rowand CNM, MSN, [facility] Center[facility]ACNM 12/11/2014, 10:11 AM

## 2015-06-22 ENCOUNTER — Emergency Department (HOSPITAL_COMMUNITY)
Admission: EM | Admit: 2015-06-22 | Discharge: 2015-06-23 | Disposition: A | Discharge status: Home / Self Care | Payer: BC Managed Care – PPO | Attending: Emergency Medicine | Admitting: Emergency Medicine

## 2015-06-22 ENCOUNTER — Emergency Department (HOSPITAL_COMMUNITY): Payer: BC Managed Care – PPO

## 2015-06-22 ENCOUNTER — Encounter (HOSPITAL_COMMUNITY): Payer: Self-pay | Admitting: Emergency Medicine

## 2015-06-22 DIAGNOSIS — R079 Chest pain, unspecified: Secondary | ICD-10-CM | POA: Insufficient documentation

## 2015-06-22 DIAGNOSIS — R000 Tachycardia, unspecified: Secondary | ICD-10-CM | POA: Diagnosis not present

## 2015-06-22 DIAGNOSIS — Z79899 Other long term (current) drug therapy: Secondary | ICD-10-CM | POA: Insufficient documentation

## 2015-06-22 LAB — I-STAT BETA HCG BLOOD, ED (MC, WL, AP ONLY): I-stat hCG, quantitative: <5 m[IU]/mL (ref <5)

## 2015-06-22 LAB — D-DIMER, QUANTITATIVE (NOT AT ARMC): D-Dimer, Quant: 0.7 ug/mL-FEU — ABNORMAL HIGH (ref 0.00–0.50)

## 2015-06-22 MED ORDER — SODIUM CHLORIDE 0.9 % IV BOLUS (SEPSIS)
1000.0000 mL | Freq: Once | INTRAVENOUS | Status: AC
  Administered 2015-06-22: 1000 mL via INTRAVENOUS

## 2015-06-22 MED ORDER — IOHEXOL 350 MG/ML SOLN
100.0000 mL | Freq: Once | INTRAVENOUS | Status: AC | PRN
  Administered 2015-06-22: 100 mL via INTRAVENOUS

## 2015-06-22 NOTE — ED Notes (Signed)
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

## 2015-06-22 NOTE — ED Provider Notes (Signed)
CSN: [id number]     Arrival date & time 06/22/15  1944 History  By signing my name below, I, Placido Sou, attest that this documentation has been prepared under the direction and in the presence of [name], MD. Electronically Signed: Placido Sou, ED Scribe. 06/22/2015. 10:05 PM.   Chief Complaint  Patient presents with  . Chest Pain   The history is provided by the patient. No language interpreter was used.    HPI Comments: Alison Wells is a 24 y.o. female who is a nursing mother of a 60 month old presents to the Emergency Department complaining of mild, centralized, CP with onset PTA. She describes the pain as "sharp and tight like someone was squeezing my chest" further noting it began while she was lying on the couch. Pt notes it worsened intermittently during deep breaths and that it has mostly alleviated. Pt notes recently having been sick for the past week and had experienced body aches and subjective fever. Pt denies being on oral birth control currently. She denies leg swelling and leg pain.   Past Medical History  Diagnosis Date  . Medical history non-contributory    Past Surgical History  Procedure Laterality Date  . Tonsillectomy    . Cesarean section N/A 12/09/2014    Procedure: CESAREAN SECTION;  Surgeon: Shea Evans, MD;  Location: [facility] ORS;  Service: Obstetrics;  Laterality: N/A;   Family History  Problem Relation Age of Onset  . Hypertension Mother   . Diabetes Father   . Hypertension Father    Social History  Substance Use Topics  . Smoking status: Never Smoker   . Smokeless tobacco: Never Used  . Alcohol Use: No   OB History    Gravida Para Term Preterm AB TAB SAB Ectopic Multiple Living   0 1     Review of Systems  Respiratory: Positive for chest tightness.   Cardiovascular: Positive for chest pain. Negative for leg swelling.  All other systems reviewed and are negative.  Allergies  Review of patient's allergies indicates no known  allergies.  Home Medications   Prior to Admission medications   Medication Sig Start Date End Date Taking? Authorizing Provider  Docosahexaenoic Acid (DHA PO) Take 1 tablet by mouth daily.   Yes Historical Provider, MD  Nutritional Supplements (JUICE PLUS FIBRE PO) Take 2 tablets by mouth daily.   Yes Historical Provider, MD  Prenatal MV-Min-Fe Fum-FA-DHA [facility] PRENATAL EARLY SHIELD PO) Take 1 tablet by mouth daily.   Yes Historical Provider, MD   BP 101/63 mmHg  Pulse 112  Temp(Src) 98.5 F (36.9 C) (Oral)  Resp 23  Ht  (1.702 m)  Wt 164 lb (74.39 kg)  BMI 25.68 kg/m2  SpO2 97%  LMP 06/12/2015 Physical Exam  Constitutional: She is oriented to person, place, and time. She appears well-developed and well-nourished. No distress.  HENT:  Head: Normocephalic and atraumatic.  Eyes: EOM are normal.  Neck: Normal range of motion.  Cardiovascular: Normal rate, regular rhythm and normal heart sounds.   Pulmonary/Chest: Effort normal and breath sounds normal.  Mildly tachycardic   Abdominal: Soft. She exhibits no distension. There is no tenderness.  Musculoskeletal: Normal range of motion.  No LE edema; no calf tenderness   Neurological: She is alert and oriented to person, place, and time.  Skin: Skin is warm and dry.  Psychiatric: She has a normal mood and affect. Judgment normal.  Nursing note and vitals reviewed.  ED Course  Procedures  COORDINATION OF CARE: 10:06 PM Discussed next steps with pt and she agreed to the plan.   Labs Review Labs Reviewed - No data to display  Imaging Review Dg Chest 2 View  06/22/2015  CLINICAL DATA:  Acute onset chest pain and shortness of breath EXAM: CHEST  2 VIEW COMPARISON:  None. FINDINGS: The heart size and mediastinal contours are within normal limits. Both lungs are clear. The visualized skeletal structures are unremarkable. IMPRESSION: Negative.  No active cardiopulmonary disease. Electronically Signed   By: Myles RosenthalJohn  Stahl M.D.    On: 06/22/2015 20:11   Dg Chest 2 View  06/22/2015  CLINICAL DATA:  Acute onset chest pain and shortness of breath EXAM: CHEST  2 VIEW COMPARISON:  None. FINDINGS: The heart size and mediastinal contours are within normal limits. Both lungs are clear. The visualized skeletal structures are unremarkable. IMPRESSION: Negative.  No active cardiopulmonary disease. Electronically Signed   By: Myles RosenthalJohn  Stahl M.D.   On: 06/22/2015 20:11   Ct Angio Chest Pe W/cm &/or Wo Cm  06/22/2015  CLINICAL DATA:  Acute onset of mild centralized chest pain and midsternal pressure. Nausea and vomiting. Body aches and fever. Initial encounter. EXAM: CT ANGIOGRAPHY CHEST WITH CONTRAST TECHNIQUE: Multidetector CT imaging of the chest was performed using the standard protocol during bolus administration of intravenous contrast. Multiplanar CT image reconstructions and MIPs were obtained to evaluate the vascular anatomy. CONTRAST:  100mL OMNIPAQUE IOHEXOL 350 MG/ML SOLN COMPARISON:  Chest radiograph performed earlier today at 7:56 p.m. FINDINGS: There is no evidence of pulmonary embolus. Minimal bibasilar atelectasis is noted. The lungs are otherwise clear. There is no evidence of significant focal consolidation, pleural effusion or pneumothorax. No masses are identified; no abnormal focal contrast enhancement is seen. The mediastinum is unremarkable in appearance. No mediastinal lymphadenopathy is seen. No pericardial effusion is identified. The great vessels are grossly unremarkable in appearance. No axillary lymphadenopathy is seen. The visualized portions of the thyroid gland are unremarkable in appearance. The visualized portions of the liver and spleen are unremarkable. No acute osseous abnormalities are seen. Review of the MIP images confirms the above findings. IMPRESSION: 1. No evidence of pulmonary embolus. 2. Minimal bibasilar atelectasis noted.  Lungs otherwise clear. Electronically Signed   By: Roanna RaiderJeffery  Chang M.D.   On:  06/22/2015 23:49   I have personally reviewed and evaluated these images and lab results as part of my medical decision-making.   EKG Interpretation   Date/Time:  Friday June 22 2015 19:58:12 EST Ventricular Rate:  115 PR Interval:  148 QRS Duration: 93 QT Interval:  328 QTC Calculation: 454 R Axis:   83 Text Interpretation:  Sinus tachycardia Baseline wander in lead(s) V3 V4  V5 not Confirmed by Comfort Iversen  MD, Storm Sovine ([id number]) on 06/22/2015 9:57:10 PM      MDM   Final diagnoses:  Chest pain, unspecified chest pain type    24yF with CP. Atypical for acs. Ct w/o evidence of pe or dissection. Doubt emergent process. It has been determined that no acute conditions requiring further emergency intervention are present at this time. The patient has been advised of the diagnosis and plan. I reviewed any labs and imaging including any potential incidental findings. We have discussed signs and symptoms that warrant return to the ED and they are listed in the discharge instructions.     Raeford RazorStephen Noal Abshier, MD 07/03/15 [phone]

## 2015-06-22 NOTE — ED Notes (Signed)
Pt states she has not felt good all week, body aches, decreased appetite

## 2015-06-23 NOTE — Discharge Instructions (Signed)
Nonspecific Chest Pain  °Chest pain can be caused by many different conditions. There is always a chance that your pain could be related to something serious, such as a heart attack or a blood clot in your lungs. Chest pain can also be caused by conditions that are not life-threatening. If you have chest pain, it is very important to follow up with your health care provider. °CAUSES  °Chest pain can be caused by: °· Heartburn. °· Pneumonia or bronchitis. °· Anxiety or stress. °· Inflammation around your heart (pericarditis) or lung (pleuritis or pleurisy). °· A blood clot in your lung. °· A collapsed lung (pneumothorax). It can develop suddenly on its own (spontaneous pneumothorax) or from trauma to the chest. °· Shingles infection (varicella-zoster virus). °· Heart attack. °· Damage to the bones, muscles, and cartilage that make up your chest wall. This can include: °¨ Bruised bones due to injury. °¨ Strained muscles or cartilage due to frequent or repeated coughing or overwork. °¨ Fracture to one or more ribs. °¨ Sore cartilage due to inflammation (costochondritis). °RISK FACTORS  °Risk factors for chest pain may include: °· Activities that increase your risk for trauma or injury to your chest. °· Respiratory infections or conditions that cause frequent coughing. °· Medical conditions or overeating that can cause heartburn. °· Heart disease or family history of heart disease. °· Conditions or health behaviors that increase your risk of developing a blood clot. °· Having had chicken pox (varicella zoster). °SIGNS AND SYMPTOMS °Chest pain can feel like: °· Burning or tingling on the surface of your chest or deep in your chest. °· Crushing, pressure, aching, or squeezing pain. °· Dull or sharp pain that is worse when you move, cough, or take a deep breath. °· Pain that is also felt in your back, neck, shoulder, or arm, or pain that spreads to any of these areas. °Your chest pain may come and go, or it may stay  constant. °DIAGNOSIS °Lab tests or other studies may be needed to find the cause of your pain. Your health care provider may have you take a test called an ambulatory ECG (electrocardiogram). An ECG records your heartbeat patterns at the time the test is performed. You may also have other tests, such as: °· Transthoracic echocardiogram (TTE). During echocardiography, sound waves are used to create a picture of all of the heart structures and to look at how blood flows through your heart. °· Transesophageal echocardiogram (TEE). This is a more advanced imaging test that obtains images from inside your body. It allows your health care provider to see your heart in finer detail. °· Cardiac monitoring. This allows your health care provider to monitor your heart rate and rhythm in real time. °· Holter monitor. This is a portable device that records your heartbeat and can help to diagnose abnormal heartbeats. It allows your health care provider to track your heart activity for several days, if needed. °· Stress tests. These can be done through exercise or by taking medicine that makes your heart beat more quickly. °· Blood tests. °· Imaging tests. °TREATMENT  °Your treatment depends on what is causing your chest pain. Treatment may include: °· Medicines. These may include: °¨ Acid blockers for heartburn. °¨ Anti-inflammatory medicine. °¨ Pain medicine for inflammatory conditions. °¨ Antibiotic medicine, if an infection is present. °¨ Medicines to dissolve blood clots. °¨ Medicines to treat coronary artery disease. °· Supportive care for conditions that do not require medicines. This may include: °¨ Resting. °¨ Applying heat   or cold packs to injured areas. °¨ Limiting activities until pain decreases. °HOME CARE INSTRUCTIONS °· If you were prescribed an antibiotic medicine, finish it all even if you start to feel better. °· Avoid any activities that bring on chest pain. °· Do not use any tobacco products, including  cigarettes, chewing tobacco, or electronic cigarettes. If you need help quitting, ask your health care provider. °· Do not drink alcohol. °· Take medicines only as directed by your health care provider. °· Keep all follow-up visits as directed by your health care provider. This is important. This includes any further testing if your chest pain does not go away. °· If heartburn is the cause for your chest pain, you may be told to keep your head raised (elevated) while sleeping. This reduces the chance that acid will go from your stomach into your esophagus. °· Make lifestyle changes as directed by your health care provider. These may include: °¨ Getting regular exercise. Ask your health care provider to suggest some activities that are safe for you. °¨ Eating a heart-healthy diet. A registered dietitian can help you to learn healthy eating options. °¨ Maintaining a healthy weight. °¨ Managing diabetes, if necessary. °¨ Reducing stress. °SEEK MEDICAL CARE IF: °· Your chest pain does not go away after treatment. °· You have a rash with blisters on your chest. °· You have a fever. °SEEK IMMEDIATE MEDICAL CARE IF:  °· Your chest pain is worse. °· You have an increasing cough, or you cough up blood. °· You have severe abdominal pain. °· You have severe weakness. °· You faint. °· You have chills. °· You have sudden, unexplained chest discomfort. °· You have sudden, unexplained discomfort in your arms, back, neck, or jaw. °· You have shortness of breath at any time. °· You suddenly start to sweat, or your skin gets clammy. °· You feel nauseous or you vomit. °· You suddenly feel light-headed or dizzy. °· Your heart begins to beat quickly, or it feels like it is skipping beats. °These symptoms may represent a serious problem that is an emergency. Do not wait to see if the symptoms will go away. Get medical help right away. Call your local emergency services (911 in the [place] Do not drive yourself to the hospital. °  °This  information is not intended to replace advice given to you by your health care provider. Make sure you discuss any questions you have with your health care provider. °  °Document Released: 04/09/2005 Document Revised: 07/21/2014 Document Reviewed: 02/03/2014 °Elsevier Interactive Patient Education ©2016 [employer] ° °

## 2017-07-04 IMAGING — CT CT ANGIO CHEST
2 of 6 series · 6 of 36 positions shown · IV contrast (Omnipaque 300)
Comparison: Chest radiograph performed earlier today at [DATE] p.m.

CLINICAL DATA: Acute onset of mild centralized chest pain and
midsternal pressure. Nausea and vomiting. Body aches and fever.
Initial encounter.

EXAM:
CT ANGIOGRAPHY CHEST WITH CONTRAST
TECHNIQUE: Multidetector CT imaging of the chest was performed using the
standard protocol during bolus administration of intravenous
contrast. Multiplanar CT image reconstructions and MIPs were
obtained to evaluate the vascular anatomy.
CONTRAST:  100mL OMNIPAQUE IOHEXOL 350 MG/ML SOLN

[Series 4: pe 3.0 b40f · axial · 0.63mm/px · z∈[-280,-106]mm · 5 of 88 slices shown]
[im 15/88  lung]
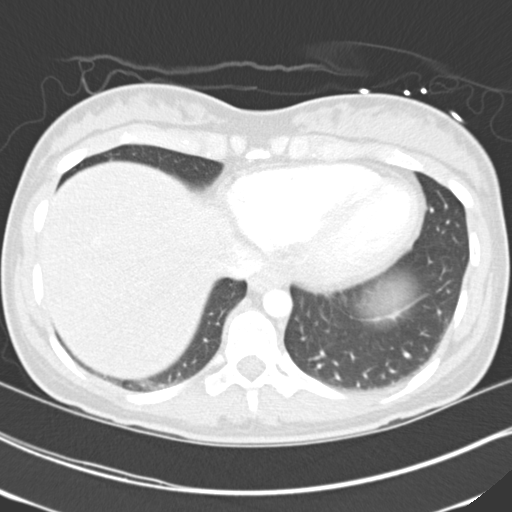
[im 30/88  mediastinal]
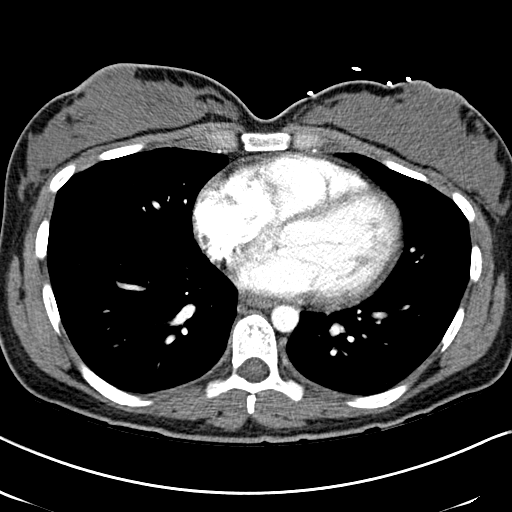
[im 44/88  lung]
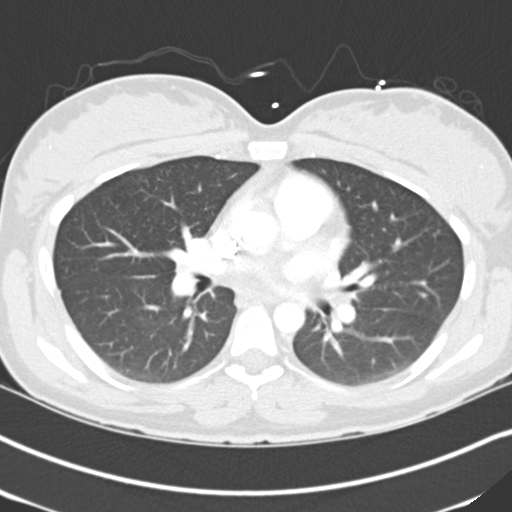
[im 59/88  mediastinal]
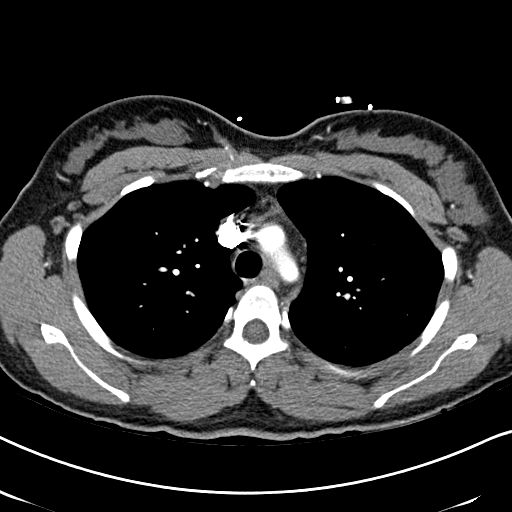
[im 73/88  lung]
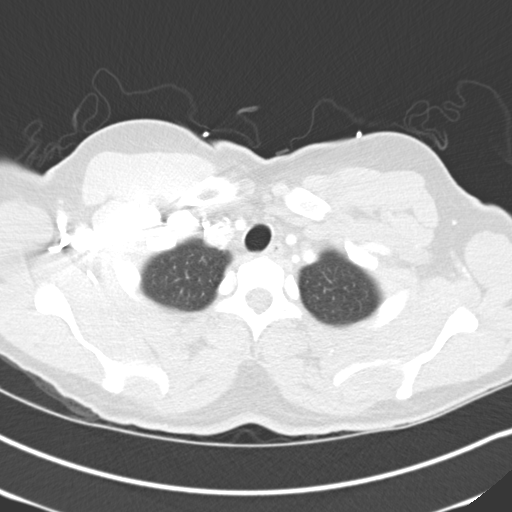

[Series 6: mpr coronal pe 3mm · coronal · 0.61mm/px · 1 of 82 slices shown]
[im 41/82  mediastinal]
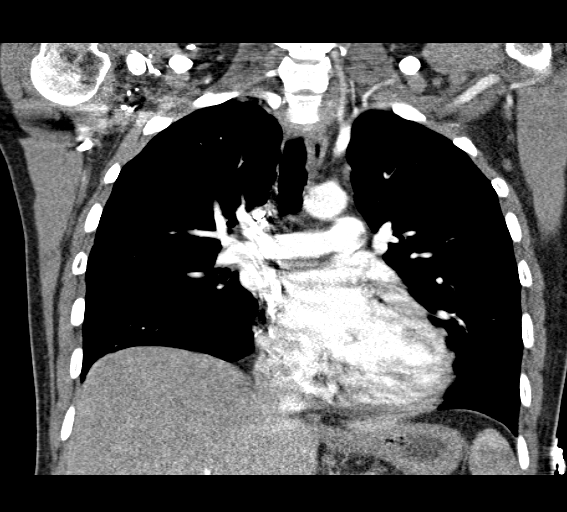

[6 of 36 positions shown; findings below may reference images not displayed]

FINDINGS: There is no evidence of pulmonary embolus.

Minimal bibasilar atelectasis is noted. The lungs are otherwise
clear. There is no evidence of significant focal consolidation,
pleural effusion or pneumothorax. No masses are identified; no
abnormal focal contrast enhancement is seen.

The mediastinum is unremarkable in appearance. No mediastinal
lymphadenopathy is seen. No pericardial effusion is identified. The
great vessels are grossly unremarkable in appearance. No axillary
lymphadenopathy is seen. The visualized portions of the thyroid
gland are unremarkable in appearance.

The visualized portions of the liver and spleen are unremarkable.

No acute osseous abnormalities are seen.

Review of the MIP images confirms the above findings.
IMPRESSION: 1. No evidence of pulmonary embolus.
2. Minimal bibasilar atelectasis noted.  Lungs otherwise clear.

## 2017-07-04 IMAGING — DX DG CHEST 2V
2 series · 2 of 2 positions shown · non-contrast
Comparison: None.

CLINICAL DATA: Acute onset chest pain and shortness of breath

EXAM:
CHEST  2 VIEW

[chest pa]
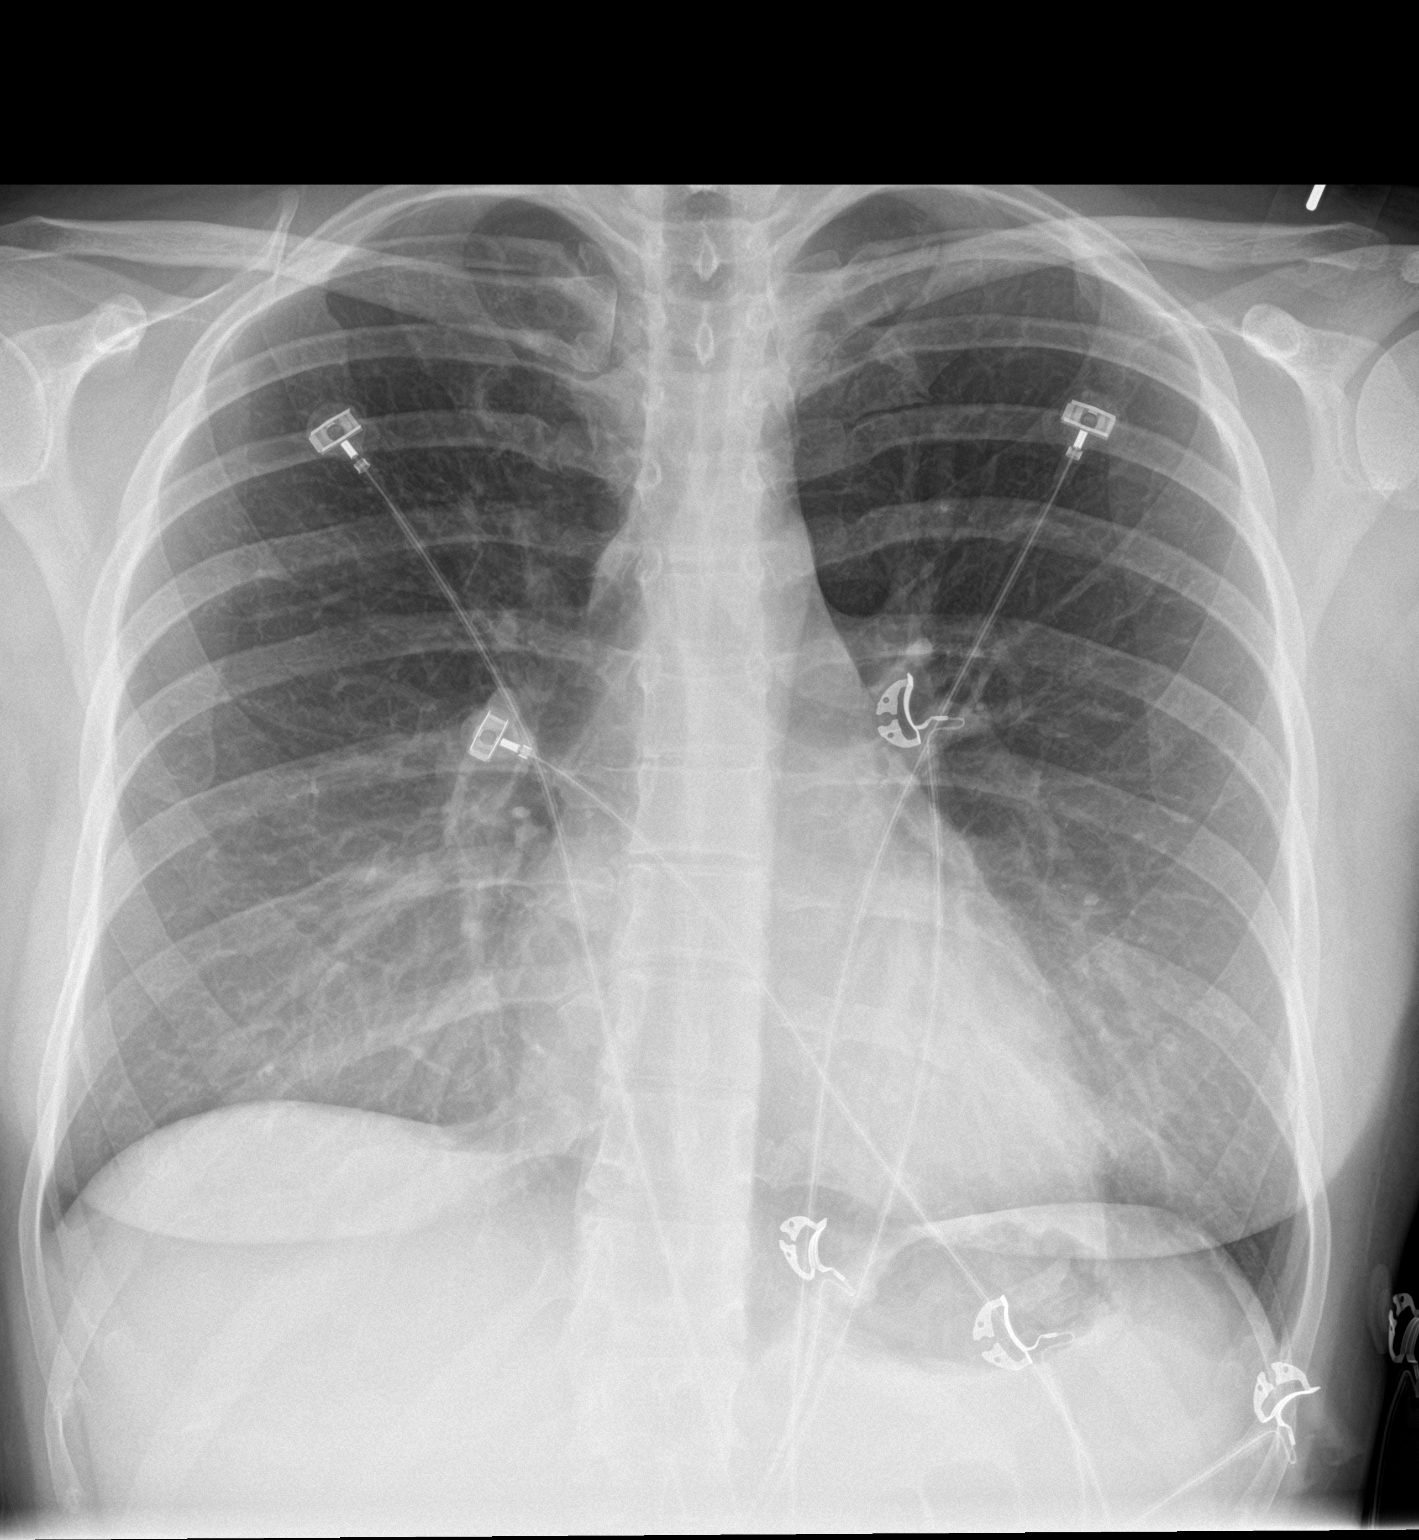

[chest lat]
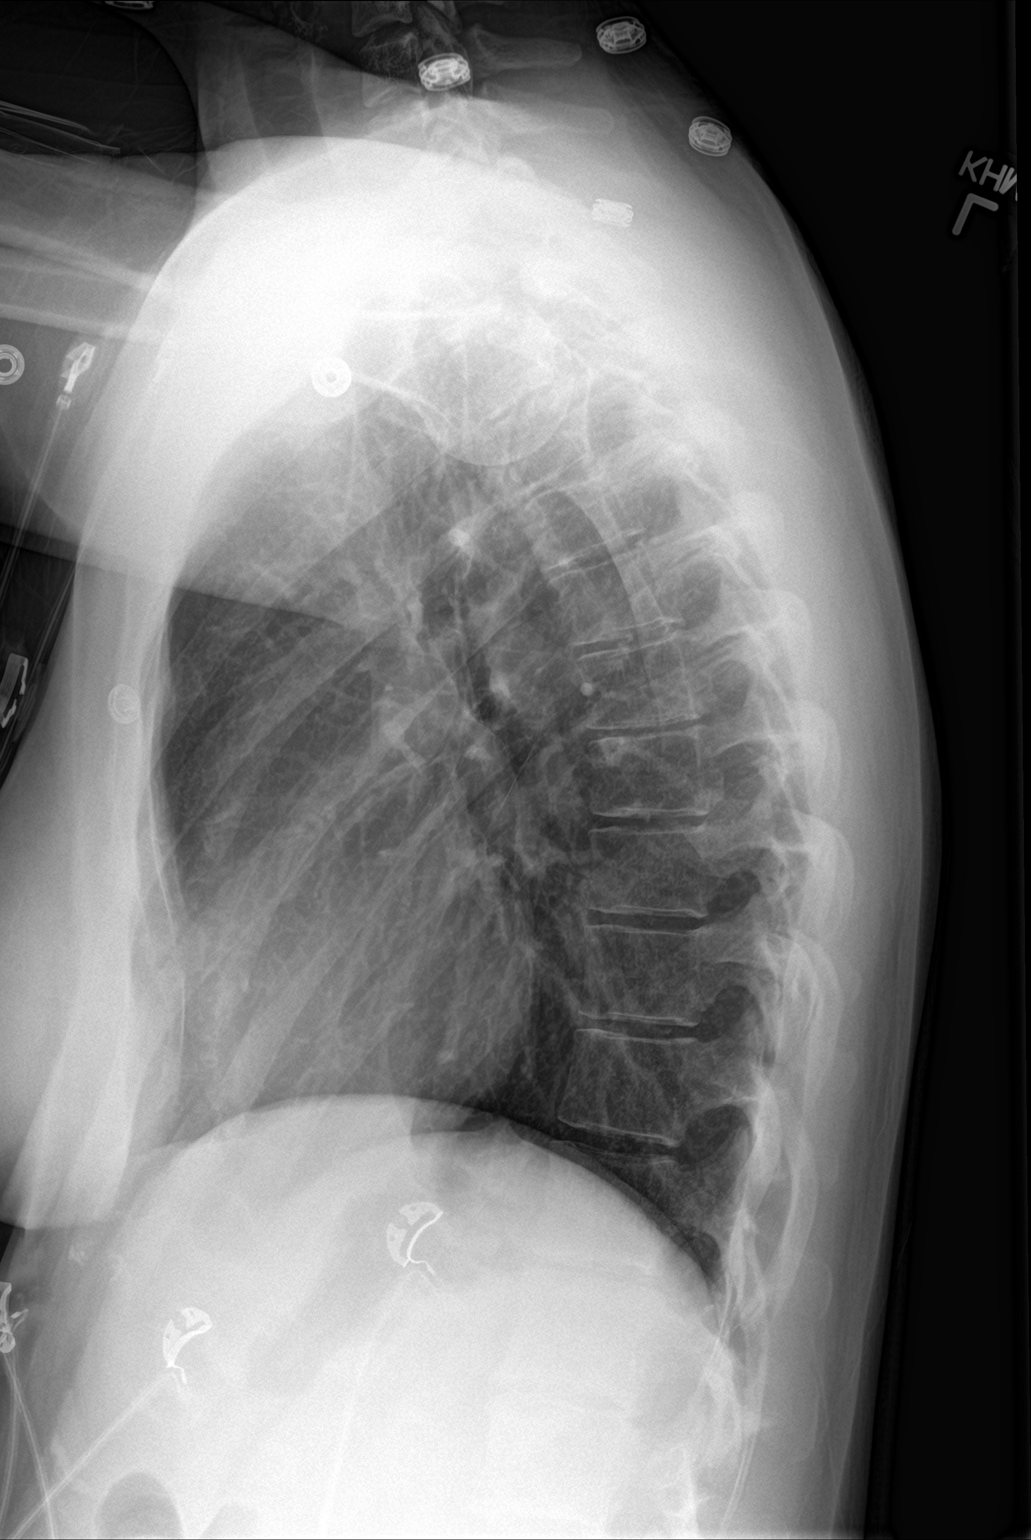

[2 of 2 positions shown; findings below may reference images not displayed]

FINDINGS: The heart size and mediastinal contours are within normal limits.
Both lungs are clear. The visualized skeletal structures are
unremarkable.
IMPRESSION: Negative.  No active cardiopulmonary disease.

## 2017-12-23 LAB — OB RESULTS CONSOLE HEPATITIS B SURFACE ANTIGEN: Hepatitis B Surface Ag: NEGATIVE

## 2017-12-23 LAB — OB RESULTS CONSOLE HIV ANTIBODY (ROUTINE TESTING): HIV: NONREACTIVE

## 2017-12-23 LAB — OB RESULTS CONSOLE RUBELLA ANTIBODY, IGM: Rubella: IMMUNE

## 2017-12-23 LAB — OB RESULTS CONSOLE GC/CHLAMYDIA
CHLAMYDIA, DNA PROBE: NEGATIVE
Gonorrhea: NEGATIVE

## 2017-12-23 LAB — OB RESULTS CONSOLE ABO/RH: RH Type: POSITIVE

## 2017-12-23 LAB — OB RESULTS CONSOLE ANTIBODY SCREEN: Antibody Screen: NEGATIVE

## 2017-12-23 LAB — OB RESULTS CONSOLE RPR: RPR: NONREACTIVE

## 2018-05-15 ENCOUNTER — Inpatient Hospital Stay (HOSPITAL_COMMUNITY)
Admission: AD | Admit: 2018-05-15 | Discharge: 2018-05-17 | DRG: 833 | Disposition: A | Discharge status: Home / Self Care | Payer: BC Managed Care – PPO | Source: Ambulatory Visit | Attending: Obstetrics | Admitting: Obstetrics

## 2018-05-15 ENCOUNTER — Other Ambulatory Visit: Payer: Self-pay

## 2018-05-15 ENCOUNTER — Encounter (HOSPITAL_COMMUNITY): Payer: Self-pay | Admitting: *Deleted

## 2018-05-15 DIAGNOSIS — R109 Unspecified abdominal pain: Secondary | ICD-10-CM | POA: Diagnosis not present

## 2018-05-15 DIAGNOSIS — O99013 Anemia complicating pregnancy, third trimester: Secondary | ICD-10-CM | POA: Diagnosis present

## 2018-05-15 DIAGNOSIS — O34219 Maternal care for unspecified type scar from previous cesarean delivery: Secondary | ICD-10-CM | POA: Diagnosis present

## 2018-05-15 DIAGNOSIS — B9620 Unspecified Escherichia coli [E. coli] as the cause of diseases classified elsewhere: Secondary | ICD-10-CM | POA: Diagnosis present

## 2018-05-15 DIAGNOSIS — O2303 Infections of kidney in pregnancy, third trimester: Principal | ICD-10-CM | POA: Diagnosis present

## 2018-05-15 DIAGNOSIS — D649 Anemia, unspecified: Secondary | ICD-10-CM | POA: Diagnosis present

## 2018-05-15 DIAGNOSIS — O99613 Diseases of the digestive system complicating pregnancy, third trimester: Secondary | ICD-10-CM | POA: Diagnosis present

## 2018-05-15 DIAGNOSIS — K219 Gastro-esophageal reflux disease without esophagitis: Secondary | ICD-10-CM | POA: Diagnosis present

## 2018-05-15 DIAGNOSIS — Z87440 Personal history of urinary (tract) infections: Secondary | ICD-10-CM | POA: Diagnosis not present

## 2018-05-15 DIAGNOSIS — Z3A31 31 weeks gestation of pregnancy: Secondary | ICD-10-CM | POA: Diagnosis not present

## 2018-05-15 DIAGNOSIS — O2300 Infections of kidney in pregnancy, unspecified trimester: Secondary | ICD-10-CM | POA: Diagnosis present

## 2018-05-15 LAB — CBC WITH DIFFERENTIAL/PLATELET
BASOS PCT: 0 %
Basophils Absolute: 0 10*3/uL (ref 0.0–0.1)
Eosinophils Absolute: 0 10*3/uL (ref 0.0–0.5)
Eosinophils Relative: 0 %
HEMATOCRIT: 32.9 % — AB (ref 36.0–46.0)
HEMOGLOBIN: 10.5 g/dL — AB (ref 12.0–15.0)
LYMPHS PCT: 9 %
Lymphs Abs: 1.4 10*3/uL (ref 0.7–4.0)
MCH: 30.5 pg (ref 26.0–34.0)
MCHC: 31.9 g/dL (ref 30.0–36.0)
MCV: 95.6 fL (ref 80.0–100.0)
MONO ABS: 1.1 10*3/uL — AB (ref 0.1–1.0)
MONOS PCT: 7 %
NEUTROS ABS: 13.3 10*3/uL — AB (ref 1.7–7.7)
NEUTROS PCT: 84 %
Platelets: 250 10*3/uL (ref 150–400)
RBC: 3.44 MIL/uL — AB (ref 3.87–5.11)
RDW: 12.7 % (ref 11.5–15.5)
WBC: 15.9 10*3/uL — ABNORMAL HIGH (ref 4.0–10.5)
nRBC: 0 % (ref 0.0–0.2)

## 2018-05-15 LAB — TYPE AND SCREEN
ABO/RH(D): O POS
ANTIBODY SCREEN: NEGATIVE

## 2018-05-15 LAB — URINALYSIS, ROUTINE W REFLEX MICROSCOPIC
Bilirubin Urine: NEGATIVE
GLUCOSE, UA: NEGATIVE mg/dL
Hgb urine dipstick: NEGATIVE
Ketones, ur: 5 mg/dL — AB
Nitrite: POSITIVE — AB
PH: 8 (ref 5.0–8.0)
Protein, ur: 100 mg/dL — AB
SPECIFIC GRAVITY, URINE: 1.012 (ref 1.005–1.030)
WBC, UA: >50 WBC/hpf — ABNORMAL HIGH (ref 0–5)

## 2018-05-15 MED ORDER — PRENATAL MULTIVITAMIN CH
1.0000 | ORAL_TABLET | Freq: Every day | ORAL | Status: DC
  Administered 2018-05-16: 1 via ORAL
  Filled 2018-05-15 (×2): qty 1

## 2018-05-15 MED ORDER — DOCUSATE SODIUM 100 MG PO CAPS
100.0000 mg | ORAL_CAPSULE | Freq: Every day | ORAL | Status: DC
  Administered 2018-05-16 – 2018-05-17 (×2): 100 mg via ORAL
  Filled 2018-05-15 (×4): qty 1

## 2018-05-15 MED ORDER — FAMOTIDINE 20 MG PO TABS
40.0000 mg | ORAL_TABLET | Freq: Every day | ORAL | Status: DC
  Administered 2018-05-16 – 2018-05-17 (×2): 40 mg via ORAL
  Filled 2018-05-15 (×2): qty 2

## 2018-05-15 MED ORDER — FERROUS SULFATE 325 (65 FE) MG PO TABS
325.0000 mg | ORAL_TABLET | Freq: Every day | ORAL | Status: DC
  Administered 2018-05-16 – 2018-05-17 (×2): 325 mg via ORAL
  Filled 2018-05-15 (×2): qty 1

## 2018-05-15 MED ORDER — SODIUM CHLORIDE 0.9 % IV SOLN
2.0000 g | INTRAVENOUS | Status: DC
  Administered 2018-05-15 – 2018-05-17 (×3): 2 g via INTRAVENOUS
  Filled 2018-05-15 (×4): qty 20

## 2018-05-15 MED ORDER — SODIUM CHLORIDE 0.9 % IV SOLN
INTRAVENOUS | Status: DC
  Administered 2018-05-15 – 2018-05-16 (×3): via INTRAVENOUS

## 2018-05-15 MED ORDER — ACETAMINOPHEN 325 MG PO TABS: 650.0000 mg | ORAL_TABLET | ORAL | Status: DC | PRN

## 2018-05-15 MED ORDER — ZOLPIDEM TARTRATE 5 MG PO TABS: 5.0000 mg | ORAL_TABLET | Freq: Every evening | ORAL | Status: DC | PRN

## 2018-05-15 MED ORDER — CALCIUM CARBONATE ANTACID 500 MG PO CHEW: 2.0000 | CHEWABLE_TABLET | ORAL | Status: DC | PRN

## 2018-05-15 NOTE — MAU Provider Note (Signed)
History     CSN: [id number]  Arrival date and time: 05/15/18 1417   First Provider Initiated Contact with Patient 05/15/18 1511      Chief Complaint  Patient presents with  . Abdominal Pain   HPI Alison Wells is a 27 y.o. G2P1001 at [redacted]w[redacted]d who presents with left flank pain. She states the pain started in her lower abdomen yesterday and moved to her left flank today. She rates the pain a 6/10 and has tried tylenol with no relief. She has a history of chronic UTIs in her life and feels like this is a similar pain. She reports some burning with urination. Denies leaking or bleeding. Reports taking monistat for a yeast infection. Reports normal fetal movement and denies contractions.   OB History    Gravida  2   Para  1   Term  1   Preterm      AB      Living  1     SAB      TAB      Ectopic      Multiple  0   Live Births  1           Past Medical History:  Diagnosis Date  . Medical history non-contributory     Past Surgical History:  Procedure Laterality Date  . CESAREAN SECTION N/A 12/09/2014   Procedure: CESAREAN SECTION;  Surgeon: Shea Evans, MD;  Location: [facility];  Service: Obstetrics;  Laterality: N/A;  . TONSILLECTOMY      Family History  Problem Relation Age of Onset  . Hypertension Mother   . Diabetes Father   . Hypertension Father     Social History   Tobacco Use  . Smoking status: Never Smoker  . Smokeless tobacco: Never Used  Substance Use Topics  . Alcohol use: No  . Drug use: No    Allergies: No Known Allergies  Medications Prior to Admission  Medication Sig Dispense Refill Last Dose  . Docosahexaenoic Acid (DHA PO) Take 1 tablet by mouth daily.   Past Week at Unknown time  . Nutritional Supplements (JUICE PLUS FIBRE PO) Take 2 tablets by mouth daily.   Past Week at Unknown time  . Prenatal MV-Min-Fe Fum-FA-DHA [facility] PRENATAL EARLY SHIELD PO) Take 1 tablet by mouth daily.   Past Week at Unknown time    Review of Systems   Constitutional: Negative.  Negative for fatigue and fever.  HENT: Negative.   Respiratory: Negative.  Negative for shortness of breath.   Cardiovascular: Negative.  Negative for chest pain.  Gastrointestinal: Negative.  Negative for abdominal pain, constipation, diarrhea, nausea and vomiting.  Genitourinary: Positive for dysuria, flank pain and frequency. Negative for vaginal bleeding and vaginal discharge.  Neurological: Negative.  Negative for dizziness and headaches.   Physical Exam   Blood pressure (!) 119/59, pulse (!) 118, temperature 98.6 F (37 C), temperature source Oral, resp. rate 18, weight 91.2 kg, SpO2 99 %, unknown if currently breastfeeding.  Physical Exam  Nursing note and vitals reviewed. Constitutional: She is oriented to person, place, and time. She appears well-developed and well-nourished. No distress.  HENT:  Head: Normocephalic.  Eyes: Pupils are equal, round, and reactive to light.  Cardiovascular: Normal rate, regular rhythm and normal heart sounds.  Respiratory: Effort normal and breath sounds normal. No respiratory distress.  GI: Soft. Bowel sounds are normal. She exhibits no distension. There is no tenderness. There is CVA tenderness (left side).  Neurological: She is alert  and oriented to person, place, and time.  Skin: Skin is warm and dry.  Psychiatric: She has a normal mood and affect. Her behavior is normal. Judgment and thought content normal.    Fetal Tracing:  Baseline: 135 Variability: moderate Accels: 15x15 Decels: none  Toco: none  MAU Course  Procedures Results for orders placed or performed during the hospital encounter of 05/15/18 (from the past 24 hour(s))  Urinalysis, Routine w reflex microscopic     Status: Abnormal   Collection Time: 05/15/18  2:47 PM  Result Value Ref Range   Color, Urine YELLOW YELLOW   APPearance CLOUDY (A) CLEAR   Specific Gravity, Urine 1.012 1.005 - 1.030   pH 8.0 5.0 - 8.0   Glucose, UA NEGATIVE  NEGATIVE mg/dL   Hgb urine dipstick NEGATIVE NEGATIVE   Bilirubin Urine NEGATIVE NEGATIVE   Ketones, ur 5 (A) NEGATIVE mg/dL   Protein, ur 865 (A) NEGATIVE mg/dL   Nitrite POSITIVE (A) NEGATIVE   Leukocytes, UA LARGE (A) NEGATIVE   RBC / HPF 11-20 0 - 5 RBC/hpf   WBC, UA >50 (H) 0 - 5 WBC/hpf   Bacteria, UA MANY (A) NONE SEEN   Squamous Epithelial / LPF 0-5 0 - 5   Mucus PRESENT   CBC with Differential/Platelet     Status: Abnormal   Collection Time: 05/15/18  3:56 PM  Result Value Ref Range   WBC 15.9 (H) 4.0 - 10.5 K/uL   RBC 3.44 (L) 3.87 - 5.11 MIL/uL   Hemoglobin 10.5 (L) 12.0 - 15.0 g/dL   HCT 78.4 (L) 69.6 - 29.5 %   MCV 95.6 80.0 - 100.0 fL   MCH 30.5 26.0 - 34.0 pg   MCHC 31.9 30.0 - 36.0 g/dL   RDW 28.4 13.2 - 44.0 %   Platelets 250 150 - 400 K/uL   nRBC 0.0 0.0 - 0.2 %   Neutrophils Relative % 84 %   Neutro Abs 13.3 (H) 1.7 - 7.7 K/uL   Lymphocytes Relative 9 %   Lymphs Abs 1.4 0.7 - 4.0 K/uL   Monocytes Relative 7 %   Monocytes Absolute 1.1 (H) 0.1 - 1.0 K/uL   Eosinophils Relative 0 %   Eosinophils Absolute 0.0 0.0 - 0.5 K/uL   Basophils Relative 0 %   Basophils Absolute 0.0 0.0 - 0.1 K/uL   MDM Prenatal records from private office reviewed. Pregnancy complicated by previous cesarean and chronic UTIs. Labs ordered and reviewed.  UA, UC CBC with Diff  Consulted with Dr. Jolayne Panther- recommends inpatient management of pyelonephritis   Dr. Ernestina Penna notified of patient in MAU and Dr. Deretha Emory recommendation of admission- will admit to High Risk OB and treat with ceftriaxone  Assessment and Plan   1. Pyelonephritis affecting pregnancy in third trimester   2. [redacted] weeks gestation of pregnancy    - Admit to High Risk OB - IV Antibiotics - Care turned over to MD  Rolm Bookbinder CNM 05/15/2018, 3:12 PM

## 2018-05-15 NOTE — H&P (Signed)
Alison Wells is a 27 y.o. G2P1001 at [redacted]w[redacted]d presenting for left flank pain. She states the pain started in her lower abdomen yesterday and moved to her left flank today. She rates the pain a 6/10 and has tried tylenol with no relief. She has a history of chronic UTIs in her life and feels like this is a similar pain. She also has h/o pyelonephritis (not in pregnancy) in the past. She reports some burning with urination. Denies leaking or bleeding.  Pt notes no contractions. Good fetal movement, No vaginal bleeding, not leaking fluid.  [facility] at [facility] since 1st trimester. - prior c/s, planning TOLAC - b/l CPC, no other soft markers on [place], nl NT, nl AFP -GERD, on Protonix   Prenatal Transfer Tool  Maternal Diabetes: No Genetic Screening: Normal Maternal Ultrasounds/Referrals: Normal Fetal Ultrasounds or other Referrals:  None Maternal Substance Abuse:  No Significant Maternal Medications:  None Significant Maternal Lab Results: None     OB History    Gravida  2   Para  1   Term  1   Preterm      AB      Living  1     SAB      TAB      Ectopic      Multiple  0   Live Births  1          Past Medical History:  Diagnosis Date  . Medical history non-contributory    Past Surgical History:  Procedure Laterality Date  . CESAREAN SECTION N/A 12/09/2014   Procedure: CESAREAN SECTION;  Surgeon: Shea Evans, MD;  Location: [facility];  Service: Obstetrics;  Laterality: N/A;  . TONSILLECTOMY     Family History: family history includes Diabetes in her father; Hypertension in her father and mother. Social History:  reports that she has never smoked. She has never used smokeless tobacco. She reports that she does not drink alcohol or use drugs.  Review of Systems - Negative except dysuria and flank pain     Blood pressure (!) 119/59, pulse (!) 125, temperature 99.5 F (37.5 C), resp. rate 18, weight 91.2 kg, SpO2 99 %, unknown if currently  breastfeeding.  Physical Exam:  Vitals:   05/15/18 1446 05/15/18 1700  BP: (!) 119/59   Pulse: (!) 118 (!) 125  Resp: 18   Temp: 98.6 F (37 C) 99.5 F (37.5 C)  TempSrc: Oral   SpO2: 99%   Weight: 91.2 kg     Gen: well appearing, no distress CV: RRR Pulm: CTAB Back: Left CVAT Abd: gravid, NT, no RUQ pain LE: trace edema, equal bilaterally, non-tender   Prenatal labs: ABO, Rh:  O+ Antibody:  neg Rubella:  immune RPR:   NT HBsAg:   neg HIV:   neg GBS:   not done yet 1 hr Glucola 104  Genetic screening nl NT, Nl AFP Anatomy US normal  CBC    Component Value Date/Time   WBC 15.9 (H) 05/15/2018 1556   RBC 3.44 (L) 05/15/2018 1556   HGB 10.5 (L) 05/15/2018 1556   HCT 32.9 (L) 05/15/2018 1556   PLT 250 05/15/2018 1556   MCV 95.6 05/15/2018 1556   MCH 30.5 05/15/2018 1556   MCHC 31.9 05/15/2018 1556   RDW 12.7 05/15/2018 1556   LYMPHSABS 1.4 05/15/2018 1556   MONOABS 1.1 (H) 05/15/2018 1556   EOSABS 0.0 05/15/2018 1556   BASOSABS 0.0 05/15/2018 1556      Assessment/Plan: 27 y.o. G2P1001  at [redacted]w[redacted]d with acute L sided pyelonephritis. - pyelo, Admit for IV ceftriaxone, 1 g q24hrs, once resolution of CVAT will consider d/c home with 2 wks of oral abx then daily macrobid for the completion of the preg with routine Ucx screening - risks for PTL, plan monitoring q shift, defer BMZ at this time - Anemia, start iron, expect drop in CBC - q shift fetal monitoring - GERD. Pepcid   Lendon Colonel 05/15/2018, 5:34 PM

## 2018-05-15 NOTE — MAU Note (Addendum)
Alison Wells is a 27 y.o.  here in MAU reporting: was seen at [facility] for a sinus infection. Found out it had a yeast infection. Presents today with left sided abdominal pain that goes into back and down leg.  Pain score: 5/10. Intermittent. Shooting +FM Today's Vitals   05/15/18 1444 05/15/18 1446  BP:  (!) 119/59  Pulse:  (!) 118  Resp:  18  Temp:  98.6 F (37 C)  TempSrc:  Oral  SpO2:  99%  Weight:  91.2 kg  PainSc: 5     Body mass index is 31.49 kg/m.    Lab orders placed from triage: ua

## 2018-05-16 NOTE — Progress Notes (Signed)
Hospital day #2 admission for left pyelonephritis  Subjective: Patient notes no further left flank pain.  No chest pain, no shortness of breath, no right upper quadrant pain.  Good fetal movement, no leakage of fluid, no vaginal bleeding.  Objective:  Vitals:   05/15/18 2326 05/16/18 0337 05/16/18 0807 05/16/18 1049  BP: (!) 102/57 (!) 102/59 110/66   Pulse: (!) 113 (!) 101 (!) 105   Resp: 17 18 18    Temp: 98.2 F (36.8 C) 97.8 F (36.6 C) 98.2 F (36.8 C)   TempSrc: Oral Oral Oral   SpO2: 97% 99% 100%   Weight:    91.2 kg  Height:    5\' 7"  (1.702 m)   General: Well-appearing, no distress Cardiovascular: Regular rate and rhythm, no tachycardia to my exam, no murmur rub or gallop Pulmonary: Clear to auscultation bilaterally Back: No costovertebral angle tenderness Abdomen: Gravid, no fundal tenderness, no right upper quadrant pain GU: Deferred Lower extremity: No edema, nontender  Toco: No contractions NST: 125, positive accelerations, no decelerations, 10 beat variability  CBC    Component Value Date/Time   WBC 15.9 (H) 05/15/2018 1556   RBC 3.44 (L) 05/15/2018 1556   HGB 10.5 (L) 05/15/2018 1556   HCT 32.9 (L) 05/15/2018 1556   PLT 250 05/15/2018 1556   MCV 95.6 05/15/2018 1556   MCH 30.5 05/15/2018 1556   MCHC 31.9 05/15/2018 1556   RDW 12.7 05/15/2018 1556   LYMPHSABS 1.4 05/15/2018 1556   MONOABS 1.1 (H) 05/15/2018 1556   EOSABS 0.0 05/15/2018 1556   BASOSABS 0.0 05/15/2018 1556    Urine culture: Pending  Assessment and plan: 27 year old G2, P1 at 8 and 1 with left-sided pyelonephritis, resolving on IV antibiotics. Pyelonephritis.  Second dose ceftriaxone due at 5 PM today.  Patient clinically improving.  Will keep patient until tomorrow afternoon and move up her third dose to after lunch.  Patient then to be discharged to home as long as she remains without fever.  Would plan 2 weeks of Bactrim then switched to daily Macrobid for the remainder of the  pregnancy.  -Prophylaxis.  Patient concerned about risks of developing yeast vaginitis.  Will discharge patient home with Diflucan prescription.  -Mild anemia.  Expect worsening due to acute kidney injury.  Will discharge to home with oral iron.  -Reactive fetal testing.  Continue every shift monitoring  Lendon Colonel 05/16/2018 10:58 AM

## 2018-05-17 LAB — CULTURE, OB URINE

## 2018-05-17 MED ORDER — CEPHALEXIN 500 MG PO CAPS: 500.0000 mg | ORAL_CAPSULE | Freq: Two times a day (BID) | ORAL | 0 refills | Status: AC

## 2018-05-17 NOTE — Progress Notes (Signed)
Pt discharged home with her mother. Follow-up care reviewed; Medications discussed; Discharge instructions reviewed; Admission discussed; Prescriptions reviewed. Pt verbalized understanding. Fetal heart rate strip reviewed with Dr. Billy Coast before pt's d/c.

## 2018-05-17 NOTE — Progress Notes (Addendum)
Denies any f/c/s; pain improved after first day of abx No c/o; no ctx/lof/vb; +fm  Patient Vitals for the past 24 hrs:  BP Temp Temp src Pulse Resp SpO2 Height Weight  05/17/18 0809 111/69 97.6 F (36.4 C) Oral (!) 101 16 100 % - -  05/16/18 2312 (!) 97/57 98.1 F (36.7 C) Oral 98 15 99 % - -  05/16/18 1924 121/66 98.4 F (36.9 C) Oral (!) 113 18 97 % - -  05/16/18 1608 112/67 98.1 F (36.7 C) Oral (!) 114 18 100 % - -  05/16/18 1147 113/73 97.6 F (36.4 C) Oral 100 18 100 % - -  05/16/18 1049 - - - - - - 5\' 7"  (1.702 m) 91.2 kg   FHT: nst x3 reviewed; baseline range 110-130s, nml variability; +accels, no decels Toco: no ctx  Results for orders placed or performed during the hospital encounter of 05/15/18 (from the past 72 hour(s))  Urinalysis, Routine w reflex microscopic     Status: Abnormal   Collection Time: 05/15/18  2:47 PM  Result Value Ref Range   Color, Urine YELLOW YELLOW   APPearance CLOUDY (A) CLEAR   Specific Gravity, Urine 1.012 1.005 - 1.030   pH 8.0 5.0 - 8.0   Glucose, UA NEGATIVE NEGATIVE mg/dL   Hgb urine dipstick NEGATIVE NEGATIVE   Bilirubin Urine NEGATIVE NEGATIVE   Ketones, ur 5 (A) NEGATIVE mg/dL   Protein, ur 409 (A) NEGATIVE mg/dL   Nitrite POSITIVE (A) NEGATIVE   Leukocytes, UA LARGE (A) NEGATIVE   RBC / HPF 11-20 0 - 5 RBC/hpf   WBC, UA >50 (H) 0 - 5 WBC/hpf   Bacteria, UA MANY (A) NONE SEEN   Squamous Epithelial / LPF 0-5 0 - 5   Mucus PRESENT     Comment: Performed at [facility], [address], [place], [place] [address]  Culture, [place] Urine     Status: Abnormal   Collection Time: 05/15/18  2:47 PM  Result Value Ref Range   Specimen Description      URINE, CLEAN CATCH Performed at [facility], [address] [place], [place] [address]    Special Requests      NONE Performed at [facility], [address] [place], [place] [address]    Culture (A)     >=100,000 COLONIES/mL ESCHERICHIA COLI NO GROUP B STREP  (S.AGALACTIAE) ISOLATED Performed at [facility], [address], [place], [place] [address]    Report Status 05/17/2018 FINAL    Organism ID, Bacteria ESCHERICHIA COLI (A)       Susceptibility   Escherichia coli - MIC*    AMPICILLIN >=32 RESISTANT Resistant     CEFAZOLIN 16 SENSITIVE Sensitive     CEFTRIAXONE <=1 SENSITIVE Sensitive     CIPROFLOXACIN <=0.25 SENSITIVE Sensitive     GENTAMICIN <=1 SENSITIVE Sensitive     IMIPENEM <=0.25 SENSITIVE Sensitive     NITROFURANTOIN <=16 SENSITIVE Sensitive     TRIMETH/SULFA <=20 SENSITIVE Sensitive     AMPICILLIN/SULBACTAM >=32 RESISTANT Resistant     PIP/TAZO 16 SENSITIVE Sensitive     Extended ESBL NEGATIVE Sensitive     * >=100,000 COLONIES/mL ESCHERICHIA COLI  CBC with Differential/Platelet     Status: Abnormal   Collection Time: 05/15/18  3:56 PM  Result Value Ref Range   WBC 15.9 (H) 4.0 - 10.5 K/uL   RBC 3.44 (L) 3.87 - 5.11 MIL/uL   Hemoglobin 10.5 (L) 12.0 - 15.0 g/dL  HCT 32.9 (L) 36.0 - 46.0 %   MCV 95.6 80.0 - 100.0 fL   MCH 30.5 26.0 - 34.0 pg   MCHC 31.9 30.0 - 36.0 g/dL   RDW 16.1 09.6 - 04.5 %   Platelets 250 150 - 400 K/uL   nRBC 0.0 0.0 - 0.2 %   Neutrophils Relative % 84 %   Neutro Abs 13.3 (H) 1.7 - 7.7 K/uL   Lymphocytes Relative 9 %   Lymphs Abs 1.4 0.7 - 4.0 K/uL   Monocytes Relative 7 %   Monocytes Absolute 1.1 (H) 0.1 - 1.0 K/uL   Eosinophils Relative 0 %   Eosinophils Absolute 0.0 0.0 - 0.5 K/uL   Basophils Relative 0 %   Basophils Absolute 0.0 0.0 - 0.1 K/uL    Comment: Performed at [facility], [address], [place], [place] [address]  Type and screen [facility]      Status: None   Collection Time: 05/15/18  4:31 PM  Result Value Ref Range   ABO/RH(D) O POS    Antibody Screen NEG    Sample Expiration      05/18/2018 Performed at [facility], [address] [place], [place] [address]    A/P: iup at 31.2 wga 1. Pyelonephritis: afebrile (has not been  febrile during admit) and sx resolved; plan next dose at 2 pm (3 hrs early for 48 hrs iv abx) and plan afternoon d/c home; contin bactrim x2 wks and then macrobid for suppression; plan f/u in 2-3 days in office 2. Fetal status reassuring, plan nst 3pm as scheduled prior to d/c home 3. Anemia - iron q day

## 2018-05-17 NOTE — Discharge Summary (Signed)
Physician Discharge Summary  Patient ID: Alison Wells MRN: [id number] DOB/AGE: Apr 11, 1991 27 y.o.  Admit date: 05/15/2018 Discharge date: 05/17/2018  Admission Diagnoses:  Discharge Diagnoses:  Active Problems:   Pyelonephritis affecting pregnancy   Discharged Condition: good  Hospital Course: The patient was admitted for left pyelonephritis at 31.0 wga.  She was given 48 hrs of ceftriaxone and her symptoms improved quickly.  She was never febrile but had back/flank pain.  She had no contractions or other complication with her pregnancy.  She was noted to have anemia and started on daily iron.  Fetal status was reassuring and good fm noted.  After 48hrs of abx, pt was sent home with oral abx x2 wks and plan to contin daily macrobid until delivery.  Consults: None  Significant Diagnostic Studies: urine culture pos for e.coli, sensitive to bactrim  Treatments: antibiotics: ceftriaxone  Discharge Exam: Blood pressure 115/72, pulse (!) 107, temperature 97.6 F (36.4 C), temperature source Oral, resp. rate 16, height 5\' 7"  (1.702 m), weight 91.2 kg, SpO2 96 %, unknown if currently breastfeeding.  A&ox3 rrr ctab Abd: soft, nt, nd LE: no edema, nt   Disposition:    Allergies as of 05/17/2018   No Known Allergies     Medication List    TAKE these medications   cephALEXin 500 MG capsule Commonly known as:  KEFLEX Take 1 capsule (500 mg total) by mouth 2 (two) times daily for 14 days.   JUICE PLUS FIBRE PO Take 2 tablets by mouth daily.   pantoprazole 20 MG tablet Commonly known as:  PROTONIX Take 20 mg by mouth daily as needed for heartburn.   SIMILAC PRENATAL EARLY SHIELD PO Take 1 tablet by mouth daily.     d/c hom, f/u in office in 2-3 days   Signed: Vick Frees 05/17/2018, 1:33 PM

## 2018-06-24 ENCOUNTER — Other Ambulatory Visit: Payer: Self-pay | Admitting: Obstetrics & Gynecology

## 2018-06-28 ENCOUNTER — Telehealth (HOSPITAL_COMMUNITY): Payer: Self-pay | Admitting: *Deleted

## 2018-06-28 NOTE — Telephone Encounter (Signed)
Preadmission screen  

## 2018-06-29 ENCOUNTER — Telehealth (HOSPITAL_COMMUNITY): Payer: Self-pay | Admitting: *Deleted

## 2018-06-29 NOTE — Telephone Encounter (Signed)
Preadmission screen  

## 2018-06-30 ENCOUNTER — Encounter (HOSPITAL_COMMUNITY): Payer: Self-pay

## 2018-07-08 NOTE — Patient Instructions (Signed)
Gennette PacKayla Churilla  07/08/2018   Your procedure is scheduled on:  07/11/2018  Enter through the Main Entrance of [facility] at 0530 AM.  Pick up the phone at the desk and dial [phone]  Call this number if you have problems the morning of surgery:[phone]  Remember:   Do not eat food:(After Midnight) Desps de medianoche.  Do not drink clear liquids: (After Midnight) Desps de medianoche.  Take these medicines the morning of surgery with A SIP OF WATER: take macrobid as prescribed. May take protonix if desired   Do not wear jewelry, make-up or nail polish.  Do not wear lotions, powders, or perfumes. Do not wear deodorant.  Do not shave 48 hours prior to surgery.  Do not bring valuables to the hospital.  [facility] is not   responsible for any belongings or valuables brought to the hospital.  Contacts, dentures or bridgework may not be worn into surgery.  Leave suitcase in the car. After surgery it may be brought to your room.  For patients admitted to the hospital, checkout time is 11:00 AM the day of              discharge.    N/A   Please read over the following fact sheets that you were given:   Surgical Site Infection Prevention

## 2018-07-09 ENCOUNTER — Encounter (HOSPITAL_COMMUNITY)
Admission: RE | Admit: 2018-07-09 | Discharge: 2018-07-09 | Disposition: A | Discharge status: Home / Self Care | Payer: BC Managed Care – PPO | Source: Ambulatory Visit | Attending: Obstetrics & Gynecology | Admitting: Obstetrics & Gynecology

## 2018-07-09 LAB — CBC
HCT: 37.2 % (ref 36.0–46.0)
HEMOGLOBIN: 12.3 g/dL (ref 12.0–15.0)
MCH: 30.1 pg (ref 26.0–34.0)
MCHC: 33.1 g/dL (ref 30.0–36.0)
MCV: 91.2 fL (ref 80.0–100.0)
Platelets: 234 10*3/uL (ref 150–400)
RBC: 4.08 MIL/uL (ref 3.87–5.11)
RDW: 14.8 % (ref 11.5–15.5)
WBC: 9.8 10*3/uL (ref 4.0–10.5)
nRBC: 0 % (ref 0.0–0.2)

## 2018-07-09 LAB — TYPE AND SCREEN
ABO/RH(D): O POS
Antibody Screen: NEGATIVE

## 2018-07-10 LAB — RPR: RPR: NONREACTIVE

## 2018-07-10 NOTE — H&P (Addendum)
Alison Wells is a 27 y.o. female presenting for repeat C-section.  Uncomplicated pregnancy. PNCare from 8 wks. Healthy female.  Prior term LTCS for arrest of descent. LGA baby.  H/o Pyelonephritis in pregnancy, admitted, completed Keflex and is on Macrobid suppression  OB History    Gravida  2   Para  1   Term  1   Preterm      AB      Living  1     SAB      TAB      Ectopic      Multiple  0   Live Births  1          Past Medical History:  Diagnosis Date  . Medical history non-contributory    Past Surgical History:  Procedure Laterality Date  . CESAREAN SECTION N/A 12/09/2014   Procedure: CESAREAN SECTION;  Surgeon: Alison EvansVaishali Jarin Cornfield, MD;  Location: [facility]S;  Service: Obstetrics;  Laterality: N/A;  . TONSILLECTOMY     Family History: family history includes Diabetes in her father; Hypertension in her father and mother. Social History:  reports that she has never smoked. She has never used smokeless tobacco. She reports that she does not drink alcohol or use drugs.    Maternal Diabetes: No Genetic Screening: Normal Maternal Ultrasounds/Referrals: Normal Fetal Ultrasounds or other Referrals:  None Maternal Substance Abuse:  No Significant Maternal Medications:  Pantoprazole. Macrobid  Significant Maternal Lab Results:  None Other Comments:  Pyelonephritis in pregnancy, Macrobid suppression after Keflex completed ROS History   unknown if currently breastfeeding. Exam Physical Exam  Physical exam:  A&O x 3, no acute distress. Pleasant HEENT neg, no thyromegaly Lungs CTA bilat CV RRR, S1S2 normal Abdo soft, non tender, non acute Extr no edema/ tenderness Pelvic def FHT  140s Toco none  Prenatal labs: ABO, Rh: --/--/O POS (12/27 1022) Antibody: NEG (12/27 1022) Rubella: Immune (06/12 0000) RPR: Non Reactive (12/27 1022)  HBsAg: Negative (06/12 0000)  HIV: Non-reactive (06/12 0000)  GBS:    neg  Assessment/Plan: 27 yo G2P1001, 39.1 wks, here for  Repeat C-section.  Risks/complications of surgery reviewed incl infection, bleeding, damage to internal organs including bladder, bowels, ureters, blood vessels, other risks from anesthesia, VTE and delayed complications of any surgery, complications in future surgery reviewed. Also discussed neonatal complications incl difficult delivery, laceration, vacuum assistance, TTN etc. Pt understands and agrees, all concerns addressed.      Alison Wells 07/10/2018, 9:15 PM  Addendum-  Pt assessed this morning. 39.2 wks, Prior C/s, here for elective repeat C-section. No change in above H&P, reviewed again.  Risks/complications of surgery reviewed incl infection, bleeding, damage to internal organs including bladder, bowels, ureters, blood vessels, other risks from anesthesia, VTE and delayed complications of any surgery, complications in future surgery reviewed. Also discussed neonatal complications incl difficult delivery, laceration, vacuum assistance, TTN etc. Pt understands and agrees, all concerns addressed.

## 2018-07-11 ENCOUNTER — Inpatient Hospital Stay (HOSPITAL_COMMUNITY): Payer: BC Managed Care – PPO | Admitting: Certified Registered Nurse Anesthetist

## 2018-07-11 ENCOUNTER — Other Ambulatory Visit: Payer: Self-pay

## 2018-07-11 ENCOUNTER — Encounter (HOSPITAL_COMMUNITY)
Admission: AD | Disposition: A | Discharge status: Home / Self Care | Payer: Self-pay | Source: Home / Self Care | Attending: Obstetrics & Gynecology

## 2018-07-11 ENCOUNTER — Encounter (HOSPITAL_COMMUNITY): Payer: Self-pay | Admitting: General Practice

## 2018-07-11 ENCOUNTER — Inpatient Hospital Stay (HOSPITAL_COMMUNITY)
Admission: AD | Admit: 2018-07-11 | Discharge: 2018-07-13 | DRG: 787 | Disposition: A | Discharge status: Home / Self Care | Payer: BC Managed Care – PPO | Attending: Obstetrics & Gynecology | Admitting: Obstetrics & Gynecology

## 2018-07-11 DIAGNOSIS — O3663X0 Maternal care for excessive fetal growth, third trimester, not applicable or unspecified: Secondary | ICD-10-CM | POA: Diagnosis present

## 2018-07-11 DIAGNOSIS — D62 Acute posthemorrhagic anemia: Secondary | ICD-10-CM | POA: Diagnosis not present

## 2018-07-11 DIAGNOSIS — Z3A39 39 weeks gestation of pregnancy: Secondary | ICD-10-CM

## 2018-07-11 DIAGNOSIS — O34211 Maternal care for low transverse scar from previous cesarean delivery: Secondary | ICD-10-CM | POA: Diagnosis present

## 2018-07-11 DIAGNOSIS — O99214 Obesity complicating childbirth: Secondary | ICD-10-CM | POA: Diagnosis present

## 2018-07-11 DIAGNOSIS — Z98891 History of uterine scar from previous surgery: Secondary | ICD-10-CM

## 2018-07-11 DIAGNOSIS — O9081 Anemia of the puerperium: Secondary | ICD-10-CM | POA: Diagnosis not present

## 2018-07-11 SURGERY — Surgical Case
Anesthesia: Spinal | Wound class: Clean Contaminated

## 2018-07-11 MED ORDER — SIMETHICONE 80 MG PO CHEW: 80.0000 mg | CHEWABLE_TABLET | ORAL | Status: DC | PRN

## 2018-07-11 MED ORDER — NALBUPHINE HCL 10 MG/ML IJ SOLN: 5.0000 mg | INTRAMUSCULAR | Status: DC | PRN

## 2018-07-11 MED ORDER — LACTATED RINGERS IV SOLN
INTRAVENOUS | Status: DC
  Administered 2018-07-11: 17:00:00 via INTRAVENOUS

## 2018-07-11 MED ORDER — ONDANSETRON HCL 4 MG/2ML IJ SOLN
INTRAMUSCULAR | Status: DC | PRN
  Administered 2018-07-11: 4 mg via INTRAVENOUS

## 2018-07-11 MED ORDER — FENTANYL CITRATE (PF) 100 MCG/2ML IJ SOLN: 25.0000 ug | INTRAMUSCULAR | Status: DC | PRN

## 2018-07-11 MED ORDER — PANTOPRAZOLE SODIUM 20 MG PO TBEC
20.0000 mg | DELAYED_RELEASE_TABLET | Freq: Every day | ORAL | Status: DC | PRN
  Filled 2018-07-11: qty 1

## 2018-07-11 MED ORDER — KETOROLAC TROMETHAMINE 30 MG/ML IJ SOLN
30.0000 mg | Freq: Four times a day (QID) | INTRAMUSCULAR | Status: AC | PRN
  Administered 2018-07-11: 30 mg via INTRAVENOUS
  Filled 2018-07-11: qty 1

## 2018-07-11 MED ORDER — NALBUPHINE HCL 10 MG/ML IJ SOLN
5.0000 mg | Freq: Once | INTRAMUSCULAR | Status: AC | PRN
  Administered 2018-07-11: 5 mg via INTRAVENOUS

## 2018-07-11 MED ORDER — NALBUPHINE HCL 10 MG/ML IJ SOLN: 5.0000 mg | Freq: Once | INTRAMUSCULAR | Status: AC | PRN

## 2018-07-11 MED ORDER — ONDANSETRON HCL 4 MG/2ML IJ SOLN
INTRAMUSCULAR | Status: AC
  Filled 2018-07-11: qty 2

## 2018-07-11 MED ORDER — ACETAMINOPHEN 500 MG PO TABS
1000.0000 mg | ORAL_TABLET | Freq: Four times a day (QID) | ORAL | Status: AC
  Administered 2018-07-11 – 2018-07-12 (×4): 1000 mg via ORAL
  Filled 2018-07-11 (×4): qty 2

## 2018-07-11 MED ORDER — NALBUPHINE HCL 10 MG/ML IJ SOLN
INTRAMUSCULAR | Status: AC
  Filled 2018-07-11: qty 1

## 2018-07-11 MED ORDER — ONDANSETRON HCL 4 MG/2ML IJ SOLN: 4.0000 mg | Freq: Three times a day (TID) | INTRAMUSCULAR | Status: DC | PRN

## 2018-07-11 MED ORDER — ACETAMINOPHEN 325 MG PO TABS: 650.0000 mg | ORAL_TABLET | ORAL | Status: DC | PRN

## 2018-07-11 MED ORDER — PHENYLEPHRINE 8 MG IN D5W 100 ML (0.08MG/ML) PREMIX OPTIME
INJECTION | INTRAVENOUS | Status: DC | PRN
  Administered 2018-07-11: 60 ug/min via INTRAVENOUS

## 2018-07-11 MED ORDER — MORPHINE SULFATE (PF) 0.5 MG/ML IJ SOLN
INTRAMUSCULAR | Status: AC
  Filled 2018-07-11: qty 10

## 2018-07-11 MED ORDER — OXYTOCIN 10 UNIT/ML IJ SOLN
INTRAMUSCULAR | Status: AC
  Filled 2018-07-11: qty 4

## 2018-07-11 MED ORDER — DIPHENHYDRAMINE HCL 50 MG/ML IJ SOLN: 12.5000 mg | INTRAMUSCULAR | Status: DC | PRN

## 2018-07-11 MED ORDER — SENNOSIDES-DOCUSATE SODIUM 8.6-50 MG PO TABS
2.0000 | ORAL_TABLET | ORAL | Status: DC
  Administered 2018-07-11 – 2018-07-12 (×2): 2 via ORAL
  Filled 2018-07-11 (×2): qty 2

## 2018-07-11 MED ORDER — KETOROLAC TROMETHAMINE 30 MG/ML IJ SOLN: 30.0000 mg | Freq: Four times a day (QID) | INTRAMUSCULAR | Status: AC | PRN

## 2018-07-11 MED ORDER — LACTATED RINGERS IV SOLN
INTRAVENOUS | Status: DC
  Administered 2018-07-11 (×2): via INTRAVENOUS

## 2018-07-11 MED ORDER — CEFAZOLIN SODIUM-DEXTROSE 2-4 GM/100ML-% IV SOLN
2.0000 g | INTRAVENOUS | Status: AC
  Administered 2018-07-11: 2 g via INTRAVENOUS
  Filled 2018-07-11: qty 100

## 2018-07-11 MED ORDER — MEPERIDINE HCL 25 MG/ML IJ SOLN: 6.2500 mg | INTRAMUSCULAR | Status: DC | PRN

## 2018-07-11 MED ORDER — DIPHENHYDRAMINE HCL 25 MG PO CAPS
25.0000 mg | ORAL_CAPSULE | ORAL | Status: DC | PRN
  Filled 2018-07-11: qty 1

## 2018-07-11 MED ORDER — LACTATED RINGERS IV SOLN: INTRAVENOUS | Status: DC

## 2018-07-11 MED ORDER — PRENATAL MULTIVITAMIN CH
1.0000 | ORAL_TABLET | Freq: Every day | ORAL | Status: DC
  Administered 2018-07-12: 1 via ORAL
  Filled 2018-07-11: qty 1

## 2018-07-11 MED ORDER — LACTATED RINGERS IV SOLN
INTRAVENOUS | Status: DC | PRN
  Administered 2018-07-11: 08:00:00 via INTRAVENOUS

## 2018-07-11 MED ORDER — FERROUS SULFATE 325 (65 FE) MG PO TABS: 325.0000 mg | ORAL_TABLET | Freq: Every day | ORAL | Status: DC

## 2018-07-11 MED ORDER — ZOLPIDEM TARTRATE 5 MG PO TABS: 5.0000 mg | ORAL_TABLET | Freq: Every evening | ORAL | Status: DC | PRN

## 2018-07-11 MED ORDER — SIMETHICONE 80 MG PO CHEW
80.0000 mg | CHEWABLE_TABLET | Freq: Three times a day (TID) | ORAL | Status: DC
  Administered 2018-07-11 – 2018-07-13 (×4): 80 mg via ORAL
  Filled 2018-07-11 (×4): qty 1

## 2018-07-11 MED ORDER — FENTANYL CITRATE (PF) 100 MCG/2ML IJ SOLN
INTRAMUSCULAR | Status: DC | PRN
  Administered 2018-07-11: 15 ug via INTRATHECAL

## 2018-07-11 MED ORDER — FENTANYL CITRATE (PF) 100 MCG/2ML IJ SOLN
INTRAMUSCULAR | Status: AC
  Filled 2018-07-11: qty 2

## 2018-07-11 MED ORDER — DIPHENHYDRAMINE HCL 25 MG PO CAPS: 25.0000 mg | ORAL_CAPSULE | Freq: Four times a day (QID) | ORAL | Status: DC | PRN

## 2018-07-11 MED ORDER — COCONUT OIL OIL
1.0000 "application " | TOPICAL_OIL | Status: DC | PRN
  Administered 2018-07-12: 1 via TOPICAL
  Filled 2018-07-11: qty 120

## 2018-07-11 MED ORDER — SIMETHICONE 80 MG PO CHEW
80.0000 mg | CHEWABLE_TABLET | ORAL | Status: DC
  Administered 2018-07-11 – 2018-07-12 (×2): 80 mg via ORAL
  Filled 2018-07-11 (×2): qty 1

## 2018-07-11 MED ORDER — TETANUS-DIPHTH-ACELL PERTUSSIS 5-2.5-18.5 LF-MCG/0.5 IM SUSP: 0.5000 mL | Freq: Once | INTRAMUSCULAR | Status: DC

## 2018-07-11 MED ORDER — IBUPROFEN 600 MG PO TABS
600.0000 mg | ORAL_TABLET | Freq: Four times a day (QID) | ORAL | Status: DC | PRN
  Administered 2018-07-11 – 2018-07-13 (×8): 600 mg via ORAL
  Filled 2018-07-11 (×8): qty 1

## 2018-07-11 MED ORDER — NALOXONE HCL 0.4 MG/ML IJ SOLN: 0.4000 mg | INTRAMUSCULAR | Status: DC | PRN

## 2018-07-11 MED ORDER — NALOXONE HCL 4 MG/10ML IJ SOLN
1.0000 ug/kg/h | INTRAVENOUS | Status: DC | PRN
  Filled 2018-07-11: qty 5

## 2018-07-11 MED ORDER — BUPIVACAINE IN DEXTROSE 0.75-8.25 % IT SOLN
INTRATHECAL | Status: DC | PRN
  Administered 2018-07-11: 1.5 mL via INTRATHECAL

## 2018-07-11 MED ORDER — OXYTOCIN 10 UNIT/ML IJ SOLN
INTRAVENOUS | Status: DC | PRN
  Administered 2018-07-11: 40 [IU] via INTRAVENOUS

## 2018-07-11 MED ORDER — SODIUM CHLORIDE 0.9% FLUSH: 3.0000 mL | INTRAVENOUS | Status: DC | PRN

## 2018-07-11 MED ORDER — DIBUCAINE 1 % RE OINT: 1.0000 "application " | TOPICAL_OINTMENT | RECTAL | Status: DC | PRN

## 2018-07-11 MED ORDER — PHENYLEPHRINE 8 MG IN D5W 100 ML (0.08MG/ML) PREMIX OPTIME
INJECTION | INTRAVENOUS | Status: AC
  Filled 2018-07-11: qty 100

## 2018-07-11 MED ORDER — OXYCODONE-ACETAMINOPHEN 5-325 MG PO TABS
1.0000 | ORAL_TABLET | Freq: Four times a day (QID) | ORAL | Status: DC | PRN
  Administered 2018-07-12 – 2018-07-13 (×3): 1 via ORAL
  Filled 2018-07-11 (×3): qty 1

## 2018-07-11 MED ORDER — OXYTOCIN 40 UNITS IN LACTATED RINGERS INFUSION - SIMPLE MED: 2.5000 [IU]/h | INTRAVENOUS | Status: AC

## 2018-07-11 MED ORDER — SCOPOLAMINE 1 MG/3DAYS TD PT72: 1.0000 | MEDICATED_PATCH | Freq: Once | TRANSDERMAL | Status: DC

## 2018-07-11 MED ORDER — MENTHOL 3 MG MT LOZG: 1.0000 | LOZENGE | OROMUCOSAL | Status: DC | PRN

## 2018-07-11 MED ORDER — METOCLOPRAMIDE HCL 5 MG/ML IJ SOLN: 10.0000 mg | Freq: Once | INTRAMUSCULAR | Status: DC | PRN

## 2018-07-11 MED ORDER — MORPHINE SULFATE (PF) 0.5 MG/ML IJ SOLN
INTRAMUSCULAR | Status: DC | PRN
  Administered 2018-07-11: .15 mg via INTRATHECAL

## 2018-07-11 MED ORDER — WITCH HAZEL-GLYCERIN EX PADS: 1.0000 "application " | MEDICATED_PAD | CUTANEOUS | Status: DC | PRN

## 2018-07-11 SURGICAL SUPPLY — 39 items
BARRIER ADHS 3X4 INTERCEED (GAUZE/BANDAGES/DRESSINGS) ×3 IMPLANT
BENZOIN TINCTURE PRP APPL 2/3 (GAUZE/BANDAGES/DRESSINGS) ×3 IMPLANT
CELLS DAT CNTRL 66122 CELL SVR (MISCELLANEOUS) ×1 IMPLANT
CHLORAPREP W/TINT 26ML (MISCELLANEOUS) ×3 IMPLANT
CLAMP CORD UMBIL (MISCELLANEOUS) IMPLANT
CLOSURE WOUND 1/2 X4 (GAUZE/BANDAGES/DRESSINGS) ×1
CLOTH BEACON ORANGE TIMEOUT ST (SAFETY) ×3 IMPLANT
DRSG OPSITE POSTOP 4X10 (GAUZE/BANDAGES/DRESSINGS) ×3 IMPLANT
ELECT REM PT RETURN 9FT ADLT (ELECTROSURGICAL) ×3
ELECTRODE REM PT RTRN 9FT ADLT (ELECTROSURGICAL) ×1 IMPLANT
EXTRACTOR VACUUM KIWI (MISCELLANEOUS) IMPLANT
EXTRACTOR VACUUM M CUP 4 TUBE (SUCTIONS) IMPLANT
EXTRACTOR VACUUM M CUP 4' TUBE (SUCTIONS)
GLOVE BIO SURGEON STRL SZ7 (GLOVE) ×3 IMPLANT
GLOVE BIOGEL PI IND STRL 7.0 (GLOVE) ×2 IMPLANT
GLOVE BIOGEL PI INDICATOR 7.0 (GLOVE) ×4
GOWN STRL REUS W/TWL LRG LVL3 (GOWN DISPOSABLE) ×6 IMPLANT
KIT ABG SYR 3ML LUER SLIP (SYRINGE) IMPLANT
NEEDLE HYPO 25X5/8 SAFETYGLIDE (NEEDLE) IMPLANT
NS IRRIG 1000ML POUR BTL (IV SOLUTION) ×3 IMPLANT
PACK C SECTION WH (CUSTOM PROCEDURE TRAY) ×3 IMPLANT
PAD OB MATERNITY 4.3X12.25 (PERSONAL CARE ITEMS) ×3 IMPLANT
RTRCTR C-SECT PINK 25CM LRG (MISCELLANEOUS) IMPLANT
RTRCTR WOUND ALEXIS 18CM MED (MISCELLANEOUS) ×3
SPONGE LAP 18X18 RF (DISPOSABLE) ×9 IMPLANT
STRIP CLOSURE SKIN 1/2X4 (GAUZE/BANDAGES/DRESSINGS) ×2 IMPLANT
SUT MNCRL 0 VIOLET CTX 36 (SUTURE) ×2 IMPLANT
SUT MONOCRYL 0 CTX 36 (SUTURE) ×4
SUT PLAIN 0 NONE (SUTURE) IMPLANT
SUT PLAIN 2 0 (SUTURE) ×2
SUT PLAIN ABS 2-0 CT1 27XMFL (SUTURE) ×1 IMPLANT
SUT VIC AB 0 CT1 27 (SUTURE) ×4
SUT VIC AB 0 CT1 27XBRD ANBCTR (SUTURE) ×2 IMPLANT
SUT VIC AB 2-0 CT1 27 (SUTURE) ×2
SUT VIC AB 2-0 CT1 TAPERPNT 27 (SUTURE) ×1 IMPLANT
SUT VIC AB 4-0 KS 27 (SUTURE) ×3 IMPLANT
SUT VICRYL 0 TIES 12 18 (SUTURE) IMPLANT
TOWEL OR 17X24 6PK STRL BLUE (TOWEL DISPOSABLE) ×3 IMPLANT
TRAY FOLEY W/BAG SLVR 14FR LF (SET/KITS/TRAYS/PACK) IMPLANT

## 2018-07-11 NOTE — Lactation Note (Signed)
This note was copied from a baby's chart. Lactation Consultation Note  Patient Name: Alison Wells ZOXWR'UToday's Date: 07/11/2018 Reason for consult: Initial assessment  Initial visit attempted at 5 hours of life, but RN came in to get Mom out of bed. LC to return.   Mom is a P2 who shared that she has flat nipples & had to use a nipple shield the entire time she lactated (10 months) with her 1st child. Mom does not yet have a nipple shield. She says once this infant latches, he does well, but getting him to latch is difficult.  Alison Wells, Alison Wells 07/11/2018, 1:49 PM

## 2018-07-11 NOTE — Transfer of Care (Signed)
Immediate Anesthesia Transfer of Care Note  Patient: Alison Wells  Procedure(s) Performed: Repeat CESAREAN SECTION (N/A )  Patient Location: PACU  Anesthesia Type:Spinal  Level of Consciousness: awake  Airway & Oxygen Therapy: Patient Spontanous Breathing  Post-op Assessment: Report given to RN and Post -op Vital signs reviewed and stable  Post vital signs: Reviewed  Last Vitals:  Vitals Value Taken Time  BP    Temp    Pulse 82 07/11/2018  8:48 AM  Resp    SpO2 98 % 07/11/2018  8:48 AM  Vitals shown include unvalidated device data.  Last Pain:  Vitals:   07/11/18 0550  TempSrc: Oral  PainSc: 0-No pain         Complications: No apparent anesthesia complications

## 2018-07-11 NOTE — Anesthesia Procedure Notes (Signed)
Spinal  Patient location during procedure: OR Staffing Anesthesiologist: Montez Hageman, MD Performed: anesthesiologist  Preanesthetic Checklist Completed: patient identified, site marked, surgical consent, pre-op evaluation, timeout performed, IV checked, risks and benefits discussed and monitors and equipment checked Spinal Block Patient position: sitting Prep: DuraPrep Patient monitoring: heart rate, continuous pulse ox and blood pressure Approach: right paramedian Location: L4-5 Injection technique: single-shot Needle Needle type: Sprotte  Needle gauge: 24 G Needle length: 9 cm Additional Notes Expiration date of kit checked and confirmed. Patient tolerated procedure well, without complications.

## 2018-07-11 NOTE — Lactation Note (Signed)
This note was copied from a baby's chart. Lactation Consultation Note  Patient Name: Alison Wells [id number]'[id number] Date: 07/11/2018 Reason for consult: Initial assessment  Infant at 7 hours of age. I was asked by RN to return to room. Infant frantic despite having just been fed 8 mL. He was fed another 18 mL of EBM by this [place]. (Mom has been leaking for months & I anticipate that her milk will come to volume tomorrow or soon thereafter. Mom had an abundant supply with her 1st child).  When Mom was not feeling as frustrated & infant was calmer, nipple shield was applied and prefilled. (Two different-size nipple shields had already been provided to Mom by an Therapist, sports). The size 24 is more appropriate for this dyad than the size 20.   It was difficult for him to finger-feed & to latch even with a pre-filled nipple shield. When infant was spoon-feeding, his tongue would become heart-shaped at times on extension. Infant also seems to have a higher palate with some restricted tongue elevation. Mom knows the above details, but also understands there is a good chance that this is just first-day difficulties with breastfeeding.   RN has brought a DEBP kit into room. RN understands that we don't want to be too aggresive with pumping so as not to potentially throw her into oversupply. At this point, I think it would be ideal to have enough EBM on hand for 1 feeding until infant's ability improves at the breast.   Matthias Hughs [facility] 07/11/2018, 3:37 PM

## 2018-07-11 NOTE — Op Note (Signed)
07/11/2018 Cesarean Section Procedure Note   Alison Wells  Procedure: Repeat Low Transverse C-section  Indications: Scheduled Proceedure/Maternal Request, suspect macrosomia  Pre-operative Diagnosis: Previous Cesarean Section, 39 weeks  Post-operative Diagnosis: Same   Surgeon:  Shea EvansMody, Teion Ballin, MD   Assistants: Marlinda Mikeanya Bailey, CNM  Anesthesia: spinal   Procedure Details:  The patient was seen in the Holding Room. The risks, benefits, complications, treatment options, and expected outcomes were discussed with the patient. The patient concurred with the proposed plan, giving informed consent. identified as Alison Wells and the procedure verified as C-Section Delivery. A Time Out was held and the above information confirmed. 2 gm Ancef given.  After induction of anesthesia, the patient was draped and prepped in the usual sterile manner, foley was draining urine well.  A pfannenstiel incision was made and carried down through the subcutaneous tissue to the fascia. Fascial incision was made and extended transversely. The fascia was separated from the underlying rectus tissue superiorly and inferiorly. The peritoneum was identified and entered. Peritoneal incision was extended longitudinally. Alexis-O retractor placed. The utero-vesical peritoneal reflection was incised transversely and the bladder flap was bluntly freed from the lower uterine segment. A low transverse uterine incision was made. Clear amniotic fluid. Delivered from cephalic presentation was a FEMALE infant with vigorous cry at 8 AM.  Apgar scores of 9 at one minute and 10 at five minutes. Delayed cord clamping done at 1 minute and baby handed to NICU team in attendance. Cord ph was not sent. Cord blood was obtained for evaluation. The placenta was removed Intact and appeared normal. The uterine outline, tubes and ovaries appeared normal}. The uterine incision was closed with running locked sutures of 0Monocryl. A second imbricating layer  sutured.   Hemostasis was observed. Interceed placed on hysterotomy and another piece on anterior fundal area. Alexis retractor removed. Peritoneal closure done with 2-0 Vicryl.  The fascia was then reapproximated with running sutures of 0Vicryl. The subcuticular closure was performed using 2-0plain gut. The skin was closed with 4-0Vicryl. Sterile dressings placed.   Instrument, sponge, and needle counts were correct prior the abdominal closure and were correct at the conclusion of the case.   Findings: Baby BOY delivered [facility] 8 am [facility]. Apgars 9, 10. No complications, 2 layer closure of hysterotomy.    Estimated Blood Loss: 259 mL   Total IV Fluids:  LR 2400 ml   Urine Output: 150CC OF clear urine  Specimens: cord blood   Complications: no complications  Disposition: PACU - hemodynamically stable.   Maternal Condition: stable   Baby condition / location:  Couplet care / Skin to Skin  Attending Attestation: I performed the procedure.   Signed: Surgeon(s): Shea EvansMody, Gaurav Baldree, MD

## 2018-07-11 NOTE — Anesthesia Preprocedure Evaluation (Addendum)
Anesthesia Evaluation  Patient identified by MRN, date of birth, ID band Patient awake    Reviewed: Allergy & Precautions, NPO status , Patient's Chart, lab work & pertinent test results  History of Anesthesia Complications Negative for: history of anesthetic complications  Airway Mallampati: II  TM Distance: >3 FB Neck ROM: Full    Dental no notable dental hx. (+) Dental Advisory Given   Pulmonary neg pulmonary ROS,    Pulmonary exam normal breath sounds clear to auscultation       Cardiovascular hypertension, Normal cardiovascular exam Rhythm:Regular Rate:Normal     Neuro/Psych negative neurological ROS  negative psych ROS   GI/Hepatic negative GI ROS, Neg liver ROS,   Endo/Other  obesity  Renal/GU negative Renal ROS  negative genitourinary   Musculoskeletal negative musculoskeletal ROS (+)   Abdominal   Peds negative pediatric ROS (+)  Hematology negative hematology ROS (+)   Anesthesia Other Findings   Reproductive/Obstetrics (+) Pregnancy                             Anesthesia Physical  Anesthesia Plan  ASA: II  Anesthesia Plan: Spinal   Post-op Pain Management:    Induction:   PONV Risk Score and Plan: 3 and Ondansetron, Scopolamine patch - Pre-op and Treatment may vary due to age or medical condition  Airway Management Planned: Natural Airway  Additional Equipment:   Intra-op Plan:   Post-operative Plan:   Informed Consent: I have reviewed the patients History and Physical, chart, labs and discussed the procedure including the risks, benefits and alternatives for the proposed anesthesia with the patient or authorized representative who has indicated his/her understanding and acceptance.   Dental advisory given  Plan Discussed with:   Anesthesia Plan Comments:         Anesthesia Quick Evaluation

## 2018-07-12 LAB — CBC
HCT: 31.5 % — ABNORMAL LOW (ref 36.0–46.0)
Hemoglobin: 10.3 g/dL — ABNORMAL LOW (ref 12.0–15.0)
MCH: 29.7 pg (ref 26.0–34.0)
MCHC: 32.7 g/dL (ref 30.0–36.0)
MCV: 90.8 fL (ref 80.0–100.0)
Platelets: 217 10*3/uL (ref 150–400)
RBC: 3.47 MIL/uL — ABNORMAL LOW (ref 3.87–5.11)
RDW: 15 % (ref 11.5–15.5)
WBC: 10.4 10*3/uL (ref 4.0–10.5)
nRBC: 0 % (ref 0.0–0.2)

## 2018-07-12 NOTE — Anesthesia Postprocedure Evaluation (Signed)
Anesthesia Post Note  Patient: Alison Wells  Procedure(s) Performed: Repeat CESAREAN SECTION (N/A )     Patient location during evaluation: Mother Baby Anesthesia Type: Spinal Level of consciousness: awake Pain management: satisfactory to patient Vital Signs Assessment: post-procedure vital signs reviewed and stable Respiratory status: spontaneous breathing Cardiovascular status: stable Anesthetic complications: no    Last Vitals:  Vitals:   07/12/18 0215 07/12/18 0530  BP: 113/77 113/65  Pulse: 84 77  Resp: 18 18  Temp: 36.4 C 36.8 C  SpO2: 97% 96%    Last Pain:  Vitals:   07/12/18 0530  TempSrc: Oral  PainSc: 2    Pain Goal: Patients Stated Pain Goal: 4 (07/11/18 1715)               Cephus ShellingBURGER,Donnah Levert

## 2018-07-12 NOTE — Progress Notes (Signed)
Patient's foley catheter is due to be removed, but patient requests to keep it in for a couple more hours as she "finally just fell asleep." discussed with patient importance of removing soon and will let her rest for a couple of hours and will return to remove foley. will continue to monitor

## 2018-07-12 NOTE — Progress Notes (Signed)
Subjective: Postpartum Day 1,Elective Repeat LTCS. BOY. 10'6"   Patient reports incisional pain.  Reviewed meds options, will take Oxycodone IR and Ibuprofen Tolerating PO, voided well, lochia well controlled   Objective: Vital signs in last 24 hours: Temp:  [97.6 F (36.4 C)-98.2 F (36.8 C)] 97.8 F (36.6 C) (12/30 0900) Pulse Rate:  [72-91] 86 (12/30 0900) Resp:  [18] 18 (12/30 0900) BP: (112-125)/(65-77) 112/72 (12/30 0900) SpO2:  [96 %-99 %] 97 % (12/30 0900)  Physical Exam:  General: alert and cooperative  CV RRR Lungs CTA bilat Lochia: appropriate Uterine Fundus: firm. NABS, soft, distended belly  Incision: healing well, dressing dry DVT Evaluation: No evidence of DVT seen on physical exam.  CBC Latest Ref Rng & Units 07/12/2018 07/09/2018 05/15/2018  WBC 4.0 - 10.5 K/uL 10.4 9.8 15.9(H)  Hemoglobin 12.0 - 15.0 g/dL 10.3(L) 12.3 10.5(L)  Hematocrit 36.0 - 46.0 % 31.5(L) 37.2 32.9(L)  Platelets 150 - 400 K/uL 217 234 250    Assessment/Plan: Status post Cesarean section. Doing well postoperatively.  Continue current care. PP care reviewed, Circ planned today.  Robley FriesVaishali R Velita Quirk 07/12/2018, 11:46 AM

## 2018-07-13 MED ORDER — IBUPROFEN 600 MG PO TABS: 600.0000 mg | ORAL_TABLET | Freq: Four times a day (QID) | ORAL | 0 refills | Status: DC | PRN

## 2018-07-13 MED ORDER — OXYCODONE-ACETAMINOPHEN 5-325 MG PO TABS: 1.0000 | ORAL_TABLET | Freq: Four times a day (QID) | ORAL | 0 refills | Status: DC | PRN

## 2018-07-13 NOTE — Discharge Summary (Signed)
Obstetric Discharge Summary   Patient Name: Alison Wells DOB: 07/26/1990 MRN: [id number]  Date of Admission: 07/11/2018 Date of Discharge: 07/13/2018 Date of Delivery: 07/11/18 Gestational Age at Delivery: 983w1d  [facility] - Dr. Juliene PinaMody   Antepartum complications:  Prior term LTCS for arrest of descent. LGA baby.  H/o Pyelonephritis in pregnancy, admitted, completed Keflex and is on Macrobid suppression Prenatal Labs: ABO, Rh: --/--/O POS (12/27 1022) Antibody: NEG (12/27 1022) Rubella: Immune (06/12 0000) RPR: Non Reactive (12/27 1022)  HBsAg: Negative (06/12 0000)  HIV: Non-reactive (06/12 0000)  GBS:    neg Admitting Diagnosis: Repeat LTCS and suspected LGA  Secondary Diagnoses: Patient Active Problem List   Diagnosis Date Noted  . Previous cesarean section 07/11/2018  . Postpartum care following cesarean delivery (12/29) 07/11/2018  . Status post repeat low transverse cesarean section 07/11/2018  . Pyelonephritis affecting pregnancy 05/15/2018    Date of Delivery: 07/11/18 Delivered By: Dr. Juliene PinaMody/ T. Fredric MareBailey, CNM assist Delivery Type: repeat cesarean section, low transverse incision  Newborn Data: Live born female  Birth Weight: 10 lb 6.1 oz (4710 g) APGAR: ,   Newborn Delivery   Birth date/time:  07/11/2018 08:00:00 Delivery type:  C-Section, Low Transverse Trial of labor:  No C-section categorization:  Repeat        Hospital/Postpartum Course  (Cesarean Section):  Pt. Admitted for repeat LTCS and for suspected macrosomia. See notes and delivery summary for details. Patient had an uncomplicated postpartum course.  By time of discharge on POD#2, her pain was controlled on oral pain medications; she had appropriate lochia and was ambulating, voiding without difficulty, tolerating regular diet and passing flatus.   She was deemed stable for discharge to home.     Labs: CBC Latest Ref Rng & Units 07/12/2018 07/09/2018 05/15/2018  WBC 4.0 - 10.5  K/uL 10.4 9.8 15.9(H)  Hemoglobin 12.0 - 15.0 g/dL 10.3(L) 12.3 10.5(L)  Hematocrit 36.0 - 46.0 % 31.5(L) 37.2 32.9(L)  Platelets 150 - 400 K/uL 217 234 250   O POS  Physical exam:  BP 122/78   Pulse 79   Temp 97.8 F (36.6 C) (Oral)   Resp 18   Ht 5\' 7"  (1.702 m)   Wt 95.4 kg   SpO2 98%   Breastfeeding Unknown   BMI 32.94 kg/m  General: alert and no distress Pulm: normal respiratory effort Lochia: appropriate Abdomen: soft, NT Uterine Fundus: firm, below umbilicus Perineum: healing well, no significant erythema, no significant edema ncision: c/d/i, healing well, no significant drainage, no dehiscence, no significant erythema Extremities: No evidence of DVT seen on physical exam. No lower extremity edema.   Disposition: stable, discharge to home Baby Feeding: breast milk Baby Disposition: home with mom  Contraception: did not discuss  Rh Immune globulin given: N/A Rubella vaccine given: N/A Tdap vaccine given in AP or PP setting: UTD Flu vaccine given in AP or PP setting:  UTD   Plan:  Alison Wells was discharged to home in good condition. [facility]  Discharge Instructions: Per After Visit Summary. Refer to After Visit Summary and [facility]  Activity: Advance as tolerated. Pelvic rest for 6 weeks.   Diet: Regular, Heart Healthy Discharge Medications: Allergies as of 07/13/2018   No Known Allergies     Medication List    TAKE these medications   ferrous sulfate 325 (65 FE) MG EC tablet Take 325 mg by mouth daily with breakfast.   ibuprofen 600  MG tablet Commonly known as:  ADVIL,MOTRIN Take 1 tablet (600 mg total) by mouth every 6 (six) hours as needed for mild pain or moderate pain.   JUICE PLUS FIBRE PO Take 2 tablets by mouth daily.   oxyCODONE-acetaminophen 5-325 MG tablet Commonly known as:  PERCOCET/ROXICET Take 1 tablet by mouth every 6 (six) hours as needed for moderate  pain.   pantoprazole 20 MG tablet Commonly known as:  PROTONIX Take 20 mg by mouth daily as needed for heartburn.   SIMILAC PRENATAL EARLY SHIELD PO Take 1 tablet by mouth daily.      Outpatient follow up:  Follow-up Information    Shea EvansMody, Vaishali, MD. Schedule an appointment as soon as possible for a visit in 6 week(s).   Specialty:  Obstetrics and Gynecology Why:  Postpartum visit Contact information: [address] [place] [id number] [phone]           Signed:  Carlean JewsMeredith Shanaye Rief, MSN, CNM [facility]

## 2018-07-13 NOTE — Lactation Note (Signed)
This note was copied from a baby's chart. Lactation Consultation Note  Patient Name: Alison Wells ZOXWR'UToday's Date: 07/13/2018 Reason for consult: Follow-up assessment Mom is currently pumping and obtained 30 mls from each breast.  Breasts full but not engorged.  Discussed prevention and treatment of engorgement.  Mom reports baby is latching well using 24 mm nipple shield.  Discussed post pumping for comfort and 4-6 times per day when using shield.  Questions answered.  Reviewed and encouraged outpatient services and support.  Maternal Data    Feeding Feeding Type: Bottle Fed - Formula Nipple Type: Slow - flow  LATCH Score                   Interventions    Lactation Tools Discussed/Used     Consult Status Consult Status: Complete Follow-up type: Call as needed    Huston FoleyMOULDEN, Latisha Lasch S 07/13/2018, 10:31 AM

## 2018-07-13 NOTE — Progress Notes (Signed)
POSTOPERATIVE DAY # 2 S/P Elective Repeat LTCS, baby boy    S:         Reports feeling okay, but sore and tired; baby was cluster feeding all night.  Desires early d/c home today. Reports some constipation and fear of using bathroom. Has had to use suppository with last delivery.              Tolerating po intake / no nausea / no vomiting / + flatus / no BM  Denies dizziness, SOB, or CP             Bleeding is light             Pain controlled with Motrin and Percocet             Up ad lib / ambulatory/ voiding QS  Newborn breast feeding and pumping colostrum - going well / Circumcision - completed    O:  VS: BP 122/78   Pulse 79   Temp 97.8 F (36.6 C) (Oral)   Resp 18   Ht 5\' 7"  (1.702 m)   Wt 95.4 kg   SpO2 98%   Breastfeeding Unknown   BMI 32.94 kg/m    LABS:               Recent Labs    07/12/18 0551  WBC 10.4  HGB 10.3*  PLT 217               Bloodtype: --/--/O POS (12/27 1022)  Rubella: Immune (06/12 0000)                                             I&O: Intake/Output      12/30 0701 - 12/31 0700 12/31 0701 - 01/01 0700   I.V. (mL/kg)     Other     Total Intake(mL/kg)     Urine (mL/kg/hr) 1300 (0.6)    Blood     Total Output 1300    Net -1300           Flu and Tdap 2019             Physical Exam:             Alert and Oriented X3  Lungs: Clear and unlabored  Heart: regular rate and rhythm / no murmurs  Abdomen: soft, non-tender, non-distended, actvie bowel sounds in all quadrants              Fundus: firm, non-tender, U-2             Dressing: honeycomb with steri-strips old drainage noted, otherwise dry and intact               Incision:  approximated with sutures / no erythema / no ecchymosis / no active drainage  Perineum: intact  Lochia: small, no clots   Extremities: non-pitting BLE edema, no calf pain or tenderness,   A:        POD # 2 S/P Primary LTCS            ABL Anemia compounding chronic IDA  Hx. Of pyelonephritis - on Macrobid  suppression, but has stopped now  P:        Routine postoperative care              Discharge home today  WOB book given, instructions and  warning s/s reviewed   Continue home Ferralet  Encouraged stool softeners x 1-2 weeks postpartum; okay to use Dulcolax suppository PRN for constipation. Warm liquids to encourage bowel motility   F/u with Dr. Juliene PinaMody in 6 weeks   Carlean JewsMeredith Sigmon, MSN, [facility]

## 2019-07-25 ENCOUNTER — Other Ambulatory Visit: Payer: Self-pay

## 2019-07-25 ENCOUNTER — Ambulatory Visit: Payer: 59 | Attending: Internal Medicine

## 2019-07-25 DIAGNOSIS — Z20822 Contact with and (suspected) exposure to covid-19: Secondary | ICD-10-CM

## 2019-07-26 ENCOUNTER — Telehealth: Payer: Self-pay | Admitting: *Deleted

## 2019-07-26 LAB — NOVEL CORONAVIRUS, NAA: SARS-CoV-2, NAA: NOT DETECTED

## 2019-07-26 NOTE — Telephone Encounter (Signed)
Patient calling for result-COVID- notified pending

## 2019-09-11 ENCOUNTER — Other Ambulatory Visit: Payer: Self-pay

## 2019-09-11 ENCOUNTER — Ambulatory Visit: Payer: 59 | Attending: Internal Medicine

## 2019-09-11 DIAGNOSIS — Z23 Encounter for immunization: Secondary | ICD-10-CM

## 2019-09-11 NOTE — Progress Notes (Signed)
   Covid-19 Vaccination Clinic  Name:  Alison Wells    MRN: [id number] DOB: 01/30/1991  09/11/2019  Alison Wells was observed post Covid-19 immunization for 15 minutes without incidence. She was provided with Vaccine Information Sheet and instruction to access the V-Safe system.   Alison Wells was instructed to call 911 with any severe reactions post vaccine: Marland Kitchen Difficulty breathing  . Swelling of your face and throat  . A fast heartbeat  . A bad rash all over your body  . Dizziness and weakness    Immunizations Administered    Name Date Dose VIS Date Route   Moderna COVID-19 Vaccine 09/11/2019  4:51 PM 0.5 mL 06/14/2019 Intramuscular   Manufacturer: Moderna   Lot: [id number]   NDC: 41962-229-79

## 2019-10-15 ENCOUNTER — Ambulatory Visit: Payer: 59 | Attending: Internal Medicine

## 2019-10-15 DIAGNOSIS — Z23 Encounter for immunization: Secondary | ICD-10-CM

## 2019-10-15 NOTE — Progress Notes (Signed)
   Covid-19 Vaccination Clinic  Name:  Amali Uhls    MRN: [id number] DOB: 03-Feb-1991  10/15/2019  Ms. Linville was observed post Covid-19 immunization for 15 minutes without incident. She was provided with Vaccine Information Sheet and instruction to access the V-Safe system.   Ms. Qian was instructed to call 911 with any severe reactions post vaccine: Marland Kitchen Difficulty breathing  . Swelling of face and throat  . A fast heartbeat  . A bad rash all over body  . Dizziness and weakness   Immunizations Administered    Name Date Dose VIS Date Route   Moderna COVID-19 Vaccine 10/15/2019  1:39 PM 0.5 mL 06/14/2019 Intramuscular   Manufacturer: Moderna   Lot: [id number]   NDC: 12751-700-17

## 2020-11-16 ENCOUNTER — Other Ambulatory Visit: Payer: Self-pay

## 2020-11-16 ENCOUNTER — Ambulatory Visit
Admission: RE | Admit: 2020-11-16 | Discharge: 2020-11-16 | Disposition: A | Discharge status: Home / Self Care | Payer: BC Managed Care – PPO | Source: Ambulatory Visit | Attending: Emergency Medicine | Admitting: Emergency Medicine

## 2020-11-16 VITALS — BP 107/74 | HR 104 | Temp 97.9°F | Resp 20

## 2020-11-16 DIAGNOSIS — J069 Acute upper respiratory infection, unspecified: Secondary | ICD-10-CM

## 2020-11-16 DIAGNOSIS — Z20822 Contact with and (suspected) exposure to covid-19: Secondary | ICD-10-CM | POA: Diagnosis not present

## 2020-11-16 MED ORDER — AMOXICILLIN-POT CLAVULANATE 875-125 MG PO TABS: 1.0000 | ORAL_TABLET | Freq: Two times a day (BID) | ORAL | 0 refills | Status: DC

## 2020-11-16 NOTE — ED Triage Notes (Signed)
Pt presents with nasal congestion and sinus pressure for past 2 days

## 2020-11-16 NOTE — ED Provider Notes (Signed)
HPI  SUBJECTIVE:  Alison Wells is a 30 y.o. female who presents with 2 days of greenish rhinorrhea, sinus pain and pressure, sore throat secondary to postnasal drip, cough productive of the same material as her nasal congestion.  No fevers, upper dental pain, facial swelling, bodies, headaches, loss of smell or taste, shortness of breath, nausea, vomiting, diarrhea, abdominal pain.  No known COVID exposure.  She got the second dose of the Moderna vaccine in spring 2021.  She reports sneezing, no itchy, watery eyes.  She states that she spends a good deal of time outdoors.  No antibiotics in the past month.  No antipyretic in past 6 hours.  She has tried Flonase, DayQuil and NyQuil with improvement in her symptoms.  She has not tried any antihistamines.  She states that her symptoms are worse with lying down secondary to the postnasal drip.  She had COVID in 2021, has a history of seasonal allergies that bother her around this time of year, sinusitis twice a year.  No history of diabetes, hypertension.  LMP: Has an IUD.  Denies possibility of being pregnant.  PMD: Dr. Juliene Pina   Past Medical History:  Diagnosis Date  . Medical history non-contributory     Past Surgical History:  Procedure Laterality Date  . CESAREAN SECTION N/A 12/09/2014   Procedure: CESAREAN SECTION;  Surgeon: Shea Evans, MD;  Location: [facility];  Service: Obstetrics;  Laterality: N/A;  . CESAREAN SECTION N/A 07/11/2018   Procedure: Repeat CESAREAN SECTION;  Surgeon: Shea Evans, MD;  Location: [facility];  Service: Obstetrics;  Laterality: N/A;  EDD: 07/17/18  . TONSILLECTOMY      Family History  Problem Relation Age of Onset  . Hypertension Mother   . Diabetes Father   . Hypertension Father     Social History   Tobacco Use  . Smoking status: Never Smoker  . Smokeless tobacco: Never Used  Vaping Use  . Vaping Use: Never used  Substance Use Topics  . Alcohol use: No  . Drug use: No    No current  facility-administered medications for this encounter.  Current Outpatient Medications:  .  amoxicillin-clavulanate (AUGMENTIN) 875-125 MG tablet, Take 1 tablet by mouth 2 (two) times daily. X 7 days, Disp: 14 tablet, Rfl: 0 .  ferrous sulfate 325 (65 FE) MG EC tablet, Take 325 mg by mouth daily with breakfast., Disp: , Rfl:  .  ibuprofen (ADVIL,MOTRIN) 600 MG tablet, Take 1 tablet (600 mg total) by mouth every 6 (six) hours as needed for mild pain or moderate pain., Disp: 30 tablet, Rfl: 0 .  Nutritional Supplements (JUICE PLUS FIBRE PO), Take 2 tablets by mouth daily., Disp: , Rfl:  .  pantoprazole (PROTONIX) 20 MG tablet, Take 20 mg by mouth daily as needed for heartburn. , Disp: , Rfl:  .  Prenatal MV-Min-Fe Fum-FA-DHA [facility] PRENATAL EARLY SHIELD PO), Take 1 tablet by mouth daily., Disp: , Rfl:   No Known Allergies   ROS  As noted in HPI.   Physical Exam  BP 107/74   Pulse (!) 104   Temp 97.9 F (36.6 C)   Resp 20   SpO2 95%   Constitutional: Well developed, well nourished, no acute distress Eyes:  EOMI, conjunctiva normal bilaterally HENT: Normocephalic, atraumatic,mucus membranes moist mucosa nasal congestion, swollen erythematous turbinates.  No maxillary, frontal sinus tenderness.  No obvious postnasal drip. Respiratory: Normal inspiratory effort, lungs clear bilaterally. Cardiovascular: Normal rate GI: nondistended skin: No rash, skin intact Musculoskeletal:  no deformities Neurologic: Alert & oriented x 3, no focal neuro deficits Psychiatric: Speech and behavior appropriate   ED Course   Medications - No data to display  Orders Placed This Encounter  Procedures  . Novel Coronavirus, NAA ([facility])    Standing Status:   Standing    Number of Occurrences:   1    No results found for this or any previous visit (from the past 24 hour(s)). No results found.  ED Clinical Impression  1. Upper respiratory tract infection, unspecified type   2. Encounter for  laboratory testing for COVID-19 virus      ED Assessment/Plan  Patient with URI versus allergies.  Difficult to tell which.  We will have her continue Flonase, saline nasal irrigation, try Mucinex D because she responded to the DayQuil and NyQuil.  If that does not work, and she is to try an antihistamine/decongestant combination.  Wait-and-see prescription of Augmentin.  Went over indications for starting the antibiotics.  will check a COVID as well since she is getting together with family.  Follow-up here or with her PMD as needed if not better after 2 weeks  Discussed labs, imaging, MDM, treatment plan, and plan for follow-up with patient.patient agrees with plan.   Meds ordered this encounter  Medications  . amoxicillin-clavulanate (AUGMENTIN) 875-125 MG tablet    Sig: Take 1 tablet by mouth 2 (two) times daily. X 7 days    Dispense:  14 tablet    Refill:  0      *This clinic note was created using Scientist, clinical (histocompatibility and immunogenetics). Therefore, there may be occasional mistakes despite careful proofreading.  ?    Domenick Gong, MD 11/17/20 [phone]

## 2020-11-16 NOTE — Discharge Instructions (Addendum)
Start Mucinex-D to keep the mucous thin and to decongest you.   If this does not work, then he can try an antihistamine/decongestant combination such as Claritin-D, Allegra-D, Zyrtec-D.  You may take 600 mg of motrin with 1000 mg of tylenol up to 3-4 times a day as needed for pain. This is an effective combination for pain.  Most sinus infections are viral and do not need antibiotics unless you have a high fever, have had this for 10 days, or you get better and then get sick again. Use a NeilMed sinus rinse with distilled water as often as you want to to reduce nasal congestion. Follow the directions on the box.  I would wait to start the Augmentin.  COVID test will be back in several days  Go to www.goodrx.com to look up your medications. This will give you a list of where you can find your prescriptions at the most affordable prices. Or you can ask the pharmacist what the cash price is. This is frequently cheaper than going through insurance.

## 2020-11-17 LAB — SARS-COV-2, NAA 2 DAY TAT

## 2020-11-17 LAB — NOVEL CORONAVIRUS, NAA: SARS-CoV-2, NAA: NOT DETECTED

## 2021-12-28 ENCOUNTER — Ambulatory Visit
Admission: EM | Admit: 2021-12-28 | Discharge: 2021-12-28 | Disposition: A | Discharge status: Home / Self Care | Payer: BC Managed Care – PPO | Attending: Family Medicine | Admitting: Family Medicine

## 2021-12-28 DIAGNOSIS — N3090 Cystitis, unspecified without hematuria: Secondary | ICD-10-CM | POA: Diagnosis not present

## 2021-12-28 LAB — POCT URINALYSIS DIP (MANUAL ENTRY)
Glucose, UA: 100 mg/dL — AB
Nitrite, UA: POSITIVE — AB
Protein Ur, POC: >=300 mg/dL — AB
Spec Grav, UA: 1.02 (ref 1.010–1.025)
Urobilinogen, UA: 4 E.U./dL — AB
pH, UA: 7 (ref 5.0–8.0)

## 2021-12-28 MED ORDER — CIPROFLOXACIN HCL 500 MG PO TABS: 500.0000 mg | ORAL_TABLET | Freq: Two times a day (BID) | ORAL | 0 refills | Status: DC

## 2021-12-28 NOTE — ED Triage Notes (Signed)
Pt states she has a UTI   Pt complains of burning and frequent urination and pain in abdomin   Pt states she took AZO for the past 2 days  Denies Fever

## 2021-12-30 NOTE — ED Provider Notes (Signed)
  [facility]    ASSESSMENT & PLAN:  1. Cystitis    H/O recurrent. Begin: Meds ordered this encounter  Medications   ciprofloxacin (CIPRO) 500 MG tablet    Sig: Take 1 tablet (500 mg total) by mouth 2 (two) times daily.    Dispense:  10 tablet    Refill:  0    No signs of pyelonephritis. Urine culture sent.  Will follow up with her PCP or here if not showing improvement over the next 48 hours, sooner if needed.  Outlined signs and symptoms indicating need for more acute intervention. Patient verbalized understanding. After Visit Summary given.  SUBJECTIVE:  Alison Wells is a 31 y.o. female who complains of urinary frequency, urgency and dysuria for the past 2 days. Without associated flank pain, fever, chills, vaginal discharge or bleeding. Gross hematuria: not present. No specific aggravating or alleviating factors reported. No LE edema. Normal PO intake without n/v/d. Without specific abdominal pain. Ambulatory without difficulty. OTC treatment: AZO with mild help.  LMP: Patient's last menstrual period was 12/09/2021 (approximate).  OBJECTIVE:  Vitals:   12/28/21 0812  BP: 119/82  Pulse: 86  Resp: 18  Temp: (!) 97.5 F (36.4 C)  TempSrc: Oral  SpO2: 97%   General appearance: alert; no distress Lungs: unlabored respirations Abdomen: soft GU: deferrred Back: no CVA tenderness Extremities: no edema; symmetrical with no gross deformities Skin: warm and dry Neurologic: normal gait Psychological: alert and cooperative; normal mood and affect  Labs Reviewed  POCT URINALYSIS DIP (MANUAL ENTRY) - Abnormal; Notable for the following components:      Result Value   Color, UA orange (*)    Glucose, UA =100 (*)    Bilirubin, UA small (*)    Ketones, POC UA trace (5) (*)    Blood, UA large (*)    Protein Ur, POC >=300 (*)    Urobilinogen, UA 4.0 (*)    Nitrite, UA Positive (*)    Leukocytes, UA Large (3+) (*)    All other components within normal  limits    No Known Allergies  Past Medical History:  Diagnosis Date   Medical history non-contributory    Social History   Socioeconomic History   Marital status: Married    Spouse name: Not on file   Number of children: Not on file   Years of education: Not on file   Highest education level: Not on file  Occupational History   Not on file  Tobacco Use   Smoking status: Never   Smokeless tobacco: Never  Vaping Use   Vaping Use: Never used  Substance and Sexual Activity   Alcohol use: Yes    Comment: Occas   Drug use: No   Sexual activity: Yes    Birth control/protection: None  Other Topics Concern   Not on file  Social History Narrative   Not on file   Social Determinants of Health   Financial Resource Strain: Not on file  Food Insecurity: Not on file  Transportation Needs: Not on file  Physical Activity: Not on file  Stress: Not on file  Social Connections: Not on file  Intimate Partner Violence: Not on file   Family History  Problem Relation Age of Onset   Hypertension Mother    Diabetes Father    Hypertension Father         Mardella Layman, MD 12/30/21 [phone]

## 2022-02-04 ENCOUNTER — Ambulatory Visit
Admission: EM | Admit: 2022-02-04 | Discharge: 2022-02-04 | Disposition: A | Discharge status: Home / Self Care | Payer: BC Managed Care – PPO | Attending: Family Medicine | Admitting: Family Medicine

## 2022-02-04 DIAGNOSIS — H6123 Impacted cerumen, bilateral: Secondary | ICD-10-CM

## 2022-02-04 MED ORDER — CARBAMIDE PEROXIDE 6.5 % OT SOLN: 5.0000 [drp] | Freq: Two times a day (BID) | OTIC | 0 refills | Status: DC

## 2022-02-04 NOTE — ED Triage Notes (Signed)
Pt reports right ear fullness x 3 weeks. Reports pain when she tried to clean the ear. Pt tried to flush the ear, gives no relief.

## 2022-02-04 NOTE — ED Provider Notes (Signed)
[facility]    CSN: [id number] Arrival date & time: 02/04/22  0901      History   Chief Complaint Chief Complaint  Patient presents with   Ear Fullness    HPI Alison Wells is a 31 y.o. female.   Presenting today with 3-week history of right ear fullness, muffled hearing that has worsened to the now where she feels like she has no hearing in this ear.  She has tried multiple over-the-counter earwax kits with minimal relief.  She denies ear pain, drainage, fever, chills, headache.    Past Medical History:  Diagnosis Date   Medical history non-contributory     Patient Active Problem List   Diagnosis Date Noted   Previous cesarean section 07/11/2018   Postpartum care following cesarean delivery (12/29) 07/11/2018   Status post repeat low transverse cesarean section 07/11/2018   Pyelonephritis affecting pregnancy 05/15/2018    Past Surgical History:  Procedure Laterality Date   CESAREAN SECTION N/A 12/09/2014   Procedure: CESAREAN SECTION;  Surgeon: Shea Evans, MD;  Location: [facility];  Service: Obstetrics;  Laterality: N/A;   CESAREAN SECTION N/A 07/11/2018   Procedure: Repeat CESAREAN SECTION;  Surgeon: Shea Evans, MD;  Location: [facility];  Service: Obstetrics;  Laterality: N/A;  EDD: 07/17/18   TONSILLECTOMY      OB History     Gravida  2   Para  2   Term  2   Preterm      AB      Living  2      SAB      IAB      Ectopic      Multiple  0   Live Births  2            Home Medications    Prior to Admission medications   Medication Sig Start Date End Date Taking? Authorizing Provider  carbamide peroxide (DEBROX) 6.5 % OTIC solution Place 5 drops into the right ear 2 (two) times daily. 02/04/22  Yes Particia Nearing, PA-C  amoxicillin-clavulanate (AUGMENTIN) 875-125 MG tablet Take 1 tablet by mouth 2 (two) times daily. X 7 days 11/16/20   Domenick Gong, MD  ciprofloxacin (CIPRO) 500 MG tablet Take 1 tablet (500  mg total) by mouth 2 (two) times daily. 12/28/21   Mardella Layman, MD  ferrous sulfate 325 (65 FE) MG EC tablet Take 325 mg by mouth daily with breakfast.    [provider]  ibuprofen (ADVIL,MOTRIN) 600 MG tablet Take 1 tablet (600 mg total) by mouth every 6 (six) hours as needed for mild pain or moderate pain. 07/13/18   Sigmon, Scarlette Slice, CNM  Nutritional Supplements (JUICE PLUS FIBRE PO) Take 2 tablets by mouth daily.    [provider]  pantoprazole (PROTONIX) 20 MG tablet Take 20 mg by mouth daily as needed for heartburn.  04/26/18   [provider]  Prenatal MV-Min-Fe Fum-FA-DHA [facility] PRENATAL EARLY SHIELD PO) Take 1 tablet by mouth daily.    [provider]    Family History Family History  Problem Relation Age of Onset   Hypertension Mother    Diabetes Father    Hypertension Father     Social History Social History   Tobacco Use   Smoking status: Never   Smokeless tobacco: Never  Vaping Use   Vaping Use: Never used  Substance Use Topics   Alcohol use: Yes    Comment: Occas   Drug use: No  Allergies   Patient has no known allergies.   Review of Systems Review of Systems Per HPI  Physical Exam Triage Vital Signs ED Triage Vitals  Enc Vitals Group     BP 02/04/22 0921 113/71     Pulse Rate 02/04/22 0921 77     Resp 02/04/22 0921 16     Temp 02/04/22 0921 97.8 F (36.6 C)     Temp Source 02/04/22 0921 Oral     SpO2 02/04/22 0921 96 %     Weight --      Height --      Head Circumference --      Peak Flow --      Pain Score 02/04/22 0920 3     Pain Loc --      Pain Edu? --      Excl. in GC? --    No data found.  Updated Vital Signs BP 113/71 (BP Location: Right Arm)   Pulse 77   Temp 97.8 F (36.6 C) (Oral)   Resp 16   LMP  (Within Weeks)   SpO2 96%   Visual Acuity Right Eye Distance:   Left Eye Distance:   Bilateral Distance:    Right Eye Near:   Left Eye Near:    Bilateral Near:     Physical  Exam Vitals and nursing note reviewed.  Constitutional:      Appearance: Normal appearance. She is not ill-appearing.  HENT:     Head: Atraumatic.     Right Ear: There is impacted cerumen.     Left Ear: There is impacted cerumen.     Nose: Nose normal.     Mouth/Throat:     Mouth: Mucous membranes are moist.  Eyes:     Extraocular Movements: Extraocular movements intact.     Conjunctiva/sclera: Conjunctivae normal.  Cardiovascular:     Rate and Rhythm: Normal rate and regular rhythm.     Heart sounds: Normal heart sounds.  Pulmonary:     Effort: Pulmonary effort is normal.     Breath sounds: Normal breath sounds.  Musculoskeletal:        General: Normal range of motion.     Cervical back: Normal range of motion and neck supple.  Skin:    General: Skin is warm and dry.  Neurological:     Mental Status: She is alert and oriented to person, place, and time.  Psychiatric:        Mood and Affect: Mood normal.        Thought Content: Thought content normal.        Judgment: Judgment normal.    UC Treatments / Results  Labs (all labs ordered are listed, but only abnormal results are displayed) Labs Reviewed - No data to display  EKG   Radiology No results found.  Procedures Procedures (including critical care time)  Medications Ordered in UC Medications - No data to display  Initial Impression / Assessment and Plan / UC Course  I have reviewed the triage vital signs and the nursing notes.  Pertinent labs & imaging results that were available during my care of the patient were reviewed by me and considered in my medical decision making (see chart for details).     Ear lavage performed with warm water and peroxide today with full clearance of the left cerumen impaction, partial clearance of the right cerumen impaction though there is still a packed and layer gets TM.  Unable to visualize TM on this  side, TM visualized and benign on the left.  Discussed Debrox drops,  warm water lavage and to return if still not able to clear this last bit.  Final Clinical Impressions(s) / UC Diagnoses   Final diagnoses:  Bilateral impacted cerumen   Discharge Instructions   None    ED Prescriptions     Medication Sig Dispense Auth. Provider   carbamide peroxide (DEBROX) 6.5 % OTIC solution Place 5 drops into the right ear 2 (two) times daily. 15 mL Particia Nearing, [place]      PDMP not reviewed this encounter.   Particia Nearing, [place] 02/04/22 1034

## 2022-04-10 ENCOUNTER — Encounter: Payer: Self-pay | Admitting: Emergency Medicine

## 2022-04-10 ENCOUNTER — Ambulatory Visit
Admission: EM | Admit: 2022-04-10 | Discharge: 2022-04-10 | Disposition: A | Discharge status: Home / Self Care | Payer: BC Managed Care – PPO | Attending: Family Medicine | Admitting: Family Medicine

## 2022-04-10 ENCOUNTER — Other Ambulatory Visit: Payer: Self-pay

## 2022-04-10 DIAGNOSIS — N390 Urinary tract infection, site not specified: Secondary | ICD-10-CM | POA: Diagnosis present

## 2022-04-10 LAB — POCT URINALYSIS DIP (MANUAL ENTRY)
Blood, UA: NEGATIVE
Glucose, UA: 250 mg/dL — AB
Nitrite, UA: POSITIVE — AB
Protein Ur, POC: >=300 mg/dL — AB
Spec Grav, UA: 1.01 (ref 1.010–1.025)
Urobilinogen, UA: >=8 E.U./dL — AB
pH, UA: 5 (ref 5.0–8.0)

## 2022-04-10 MED ORDER — SULFAMETHOXAZOLE-TRIMETHOPRIM 800-160 MG PO TABS: 1.0000 | ORAL_TABLET | Freq: Two times a day (BID) | ORAL | 0 refills | Status: DC

## 2022-04-10 NOTE — ED Triage Notes (Signed)
Urinary frequency, dysuria x2 days. 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 Has taken AZO.

## 2022-04-10 NOTE — ED Provider Notes (Signed)
[facility]    CSN: [id number] Arrival date & time: 04/10/22  1837      History   Chief Complaint Chief Complaint  Patient presents with   Urinary Frequency    Entered by patient    HPI Alison Wells is a 31 y.o. female.   Presenting today with 2-day history of urinary frequency, dysuria.  Denies flank pain, abdominal pain, fevers, chills, nausea, vomiting.  History of chronic urinary tract infections and it always starts just like this.  Taking Azo, probiotics with no relief.  LMP 03/23/2022.    Past Medical History:  Diagnosis Date   Medical history non-contributory     Patient Active Problem List   Diagnosis Date Noted   Previous cesarean section 07/11/2018   Postpartum care following cesarean delivery (12/29) 07/11/2018   Status post repeat low transverse cesarean section 07/11/2018   Pyelonephritis affecting pregnancy 05/15/2018    Past Surgical History:  Procedure Laterality Date   CESAREAN SECTION N/A 12/09/2014   Procedure: CESAREAN SECTION;  Surgeon: Shea Evans, MD;  Location: [facility];  Service: Obstetrics;  Laterality: N/A;   CESAREAN SECTION N/A 07/11/2018   Procedure: Repeat CESAREAN SECTION;  Surgeon: Shea Evans, MD;  Location: [facility] SU[facility];  Service: Obstetrics;  Laterality: N/A;  EDD: 07/17/18   TONSILLECTOMY      OB History     Gravida  2   Para  2   Term  2   Preterm      AB      Living  2      SAB      IAB      Ectopic      Multiple  0   Live Births  2            Home Medications    Prior to Admission medications   Medication Sig Start Date End Date Taking? Authorizing Provider  sulfamethoxazole-trimethoprim (BACTRIM DS) 800-160 MG tablet Take 1 tablet by mouth 2 (two) times daily for 5 days. 04/10/22 04/15/22 Yes Particia Nearing, PA-C  amoxicillin-clavulanate (AUGMENTIN) 875-125 MG tablet Take 1 tablet by mouth 2 (two) times daily. X 7 days 11/16/20   Domenick Gong, MD  carbamide peroxide  (DEBROX) 6.5 % OTIC solution Place 5 drops into the right ear 2 (two) times daily. 02/04/22   Particia Nearing, PA-C  ciprofloxacin (CIPRO) 500 MG tablet Take 1 tablet (500 mg total) by mouth 2 (two) times daily. 12/28/21   Mardella Layman, MD  ferrous sulfate 325 (65 FE) MG EC tablet Take 325 mg by mouth daily with breakfast.    [provider]  ibuprofen (ADVIL,MOTRIN) 600 MG tablet Take 1 tablet (600 mg total) by mouth every 6 (six) hours as needed for mild pain or moderate pain. 07/13/18   Sigmon, Scarlette Slice, CNM  Nutritional Supplements (JUICE PLUS FIBRE PO) Take 2 tablets by mouth daily.    [provider]  pantoprazole (PROTONIX) 20 MG tablet Take 20 mg by mouth daily as needed for heartburn.  04/26/18   [provider]  Prenatal MV-Min-Fe Fum-FA-DHA [facility] PRENATAL EARLY SHIELD PO) Take 1 tablet by mouth daily.    [provider]    Family History Family History  Problem Relation Age of Onset   Hypertension Mother    Diabetes Father    Hypertension Father     Social History Social History   Tobacco Use   Smoking status: Never   Smokeless tobacco: Never  Vaping  Use   Vaping Use: Never used  Substance Use Topics   Alcohol use: Yes    Comment: Occas   Drug use: No     Allergies   Patient has no known allergies.   Review of Systems Review of Systems Per HPI  Physical Exam Triage Vital Signs ED Triage Vitals  Enc Vitals Group     BP 04/10/22 1858 (!) 150/86     Pulse Rate 04/10/22 1858 78     Resp 04/10/22 1858 20     Temp 04/10/22 1858 (!) 97.1 F (36.2 C)     Temp Source 04/10/22 1858 Oral     SpO2 04/10/22 1858 96 %     Weight --      Height --      Head Circumference --      Peak Flow --      Pain Score 04/10/22 1859 2     Pain Loc --      Pain Edu? --      Excl. in GC? --    No data found.  Updated Vital Signs BP (!) 150/86 (BP Location: Right Arm)   Pulse 78   Temp (!) 97.1 F (36.2 C) (Oral)   Resp 20    LMP 03/23/2022 (Approximate)   SpO2 96%   Visual Acuity Right Eye Distance:   Left Eye Distance:   Bilateral Distance:    Right Eye Near:   Left Eye Near:    Bilateral Near:     Physical Exam Vitals and nursing note reviewed.  Constitutional:      Appearance: Normal appearance. She is not ill-appearing.  HENT:     Head: Atraumatic.  Eyes:     Extraocular Movements: Extraocular movements intact.     Conjunctiva/sclera: Conjunctivae normal.  Cardiovascular:     Rate and Rhythm: Normal rate and regular rhythm.     Heart sounds: Normal heart sounds.  Pulmonary:     Effort: Pulmonary effort is normal.     Breath sounds: Normal breath sounds.  Abdominal:     General: Bowel sounds are normal. There is no distension.     Palpations: Abdomen is soft.     Tenderness: There is no abdominal tenderness. There is no right CVA tenderness, left CVA tenderness or guarding.  Musculoskeletal:        General: Normal range of motion.     Cervical back: Normal range of motion and neck supple.  Skin:    General: Skin is warm and dry.  Neurological:     Mental Status: She is alert and oriented to person, place, and time.  Psychiatric:        Mood and Affect: Mood normal.        Thought Content: Thought content normal.        Judgment: Judgment normal.      UC Treatments / Results  Labs (all labs ordered are listed, but only abnormal results are displayed) Labs Reviewed  POCT URINALYSIS DIP (MANUAL ENTRY) - Abnormal; Notable for the following components:      Result Value   Color, UA orange (*)    Glucose, UA =250 (*)    Bilirubin, UA moderate (*)    Ketones, POC UA small (15) (*)    Protein Ur, POC >=300 (*)    Urobilinogen, UA >=8.0 (*)    Nitrite, UA Positive (*)    Leukocytes, UA Large (3+) (*)    All other components within normal limits  URINE  CULTURE    EKG   Radiology No results found.  Procedures Procedures (including critical care time)  Medications  Ordered in UC Medications - No data to display  Initial Impression / Assessment and Plan / UC Course  I have reviewed the triage vital signs and the nursing notes.  Pertinent labs & imaging results that were available during my care of the patient were reviewed by me and considered in my medical decision making (see chart for details).     Urinalysis unclear today as patient has taken Azo so numerous things came out positive.  Given her history of recurrent urinary tract infections and consistent symptoms, will start antibiotic therapy empirically while awaiting urine culture for confirmation.  Adjust as needed based on these results.  Push fluids, continue probiotics and other preventative measures.  Final Clinical Impressions(s) / UC Diagnoses   Final diagnoses:  Acute lower UTI   Discharge Instructions   None    ED Prescriptions     Medication Sig Dispense Auth. Provider   sulfamethoxazole-trimethoprim (BACTRIM DS) 800-160 MG tablet Take 1 tablet by mouth 2 (two) times daily for 5 days. 10 tablet Volney American, [place]      PDMP not reviewed this encounter.   Volney American, [place] 04/10/22 1922

## 2022-04-12 LAB — URINE CULTURE: Culture: 40000 — AB

## 2022-04-14 ENCOUNTER — Telehealth (HOSPITAL_COMMUNITY): Payer: Self-pay | Admitting: Emergency Medicine

## 2022-04-14 MED ORDER — NITROFURANTOIN MONOHYD MACRO 100 MG PO CAPS: 100.0000 mg | ORAL_CAPSULE | Freq: Two times a day (BID) | ORAL | 0 refills | Status: DC

## 2022-05-30 ENCOUNTER — Ambulatory Visit
Admission: EM | Admit: 2022-05-30 | Discharge: 2022-05-30 | Disposition: A | Discharge status: Home / Self Care | Payer: BC Managed Care – PPO | Attending: Family Medicine | Admitting: Family Medicine

## 2022-05-30 DIAGNOSIS — J069 Acute upper respiratory infection, unspecified: Secondary | ICD-10-CM | POA: Insufficient documentation

## 2022-05-30 DIAGNOSIS — Z1152 Encounter for screening for COVID-19: Secondary | ICD-10-CM | POA: Insufficient documentation

## 2022-05-30 LAB — RESP PANEL BY RT-PCR (FLU A&B, COVID) ARPGX2
Influenza A by PCR: NEGATIVE
Influenza B by PCR: NEGATIVE
SARS Coronavirus 2 by RT PCR: NEGATIVE

## 2022-05-30 MED ORDER — FLUTICASONE PROPIONATE 50 MCG/ACT NA SUSP: 1.0000 | Freq: Two times a day (BID) | NASAL | 2 refills | Status: DC

## 2022-05-30 MED ORDER — PROMETHAZINE-DM 6.25-15 MG/5ML PO SYRP: 5.0000 mL | ORAL_SOLUTION | Freq: Four times a day (QID) | ORAL | 0 refills | Status: DC | PRN

## 2022-05-30 NOTE — ED Provider Notes (Signed)
[facility]    CSN: [id number] Arrival date & time: 05/30/22  1658      History   Chief Complaint Chief Complaint  Patient presents with   Cough   Headache   Nasal Congestion    HPI Alison Wells is a 31 y.o. female.   Cough, headache, drainage down throat that started yesterday. Taking mucinex and dyquill.       Past Medical History:  Diagnosis Date   Medical history non-contributory     Patient Active Problem List   Diagnosis Date Noted   Previous cesarean section 07/11/2018   Postpartum care following cesarean delivery (12/29) 07/11/2018   Status post repeat low transverse cesarean section 07/11/2018   Pyelonephritis affecting pregnancy 05/15/2018    Past Surgical History:  Procedure Laterality Date   CESAREAN SECTION N/A 12/09/2014   Procedure: CESAREAN SECTION;  Surgeon: Shea Evans, MD;  Location: [facility];  Service: Obstetrics;  Laterality: N/A;   CESAREAN SECTION N/A 07/11/2018   Procedure: Repeat CESAREAN SECTION;  Surgeon: Shea Evans, MD;  Location: [facility] SU[facility];  Service: Obstetrics;  Laterality: N/A;  EDD: 07/17/18   TONSILLECTOMY      OB History     Gravida  2   Para  2   Term  2   Preterm      AB      Living  2      SAB      IAB      Ectopic      Multiple  0   Live Births  2            Home Medications    Prior to Admission medications   Medication Sig Start Date End Date Taking? Authorizing Provider  fluticasone (FLONASE) 50 MCG/ACT nasal spray Place 1 spray into both nostrils 2 (two) times daily. 05/30/22  Yes Particia Nearing, PA-C  ibuprofen (ADVIL,MOTRIN) 600 MG tablet Take 1 tablet (600 mg total) by mouth every 6 (six) hours as needed for mild pain or moderate pain. 07/13/18  Yes Sigmon, Meredith C, CNM  Nutritional Supplements (JUICE PLUS FIBRE PO) Take 2 tablets by mouth daily.   Yes [provider]  promethazine-dextromethorphan (PROMETHAZINE-DM) 6.25-15 MG/5ML syrup Take 5  mLs by mouth 4 (four) times daily as needed. 05/30/22  Yes Particia Nearing, PA-C  amoxicillin-clavulanate (AUGMENTIN) 875-125 MG tablet Take 1 tablet by mouth 2 (two) times daily. X 7 days 11/16/20   Domenick Gong, MD  carbamide peroxide (DEBROX) 6.5 % OTIC solution Place 5 drops into the right ear 2 (two) times daily. 02/04/22   Particia Nearing, PA-C  ciprofloxacin (CIPRO) 500 MG tablet Take 1 tablet (500 mg total) by mouth 2 (two) times daily. 12/28/21   Mardella Layman, MD  ferrous sulfate 325 (65 FE) MG EC tablet Take 325 mg by mouth daily with breakfast.    [provider]  nitrofurantoin, macrocrystal-monohydrate, (MACROBID) 100 MG capsule Take 1 capsule (100 mg total) by mouth 2 (two) times daily. 04/14/22   Merrilee Jansky, MD  pantoprazole (PROTONIX) 20 MG tablet Take 20 mg by mouth daily as needed for heartburn.  04/26/18   [provider]  Prenatal MV-Min-Fe Fum-FA-DHA [facility] PRENATAL EARLY SHIELD PO) Take 1 tablet by mouth daily.    [provider]    Family History Family History  Problem Relation Age of Onset   Hypertension Mother    Diabetes Father    Hypertension Father  Social History Social History   Tobacco Use   Smoking status: Never   Smokeless tobacco: Never  Vaping Use   Vaping Use: Never used  Substance Use Topics   Alcohol use: Yes    Comment: Occas   Drug use: No    Allergies   Patient has no known allergies.  Review of Systems Review of Systems PER HPI  Physical Exam Triage Vital Signs ED Triage Vitals  Enc Vitals Group     BP 05/30/22 1710 125/84     Pulse Rate 05/30/22 1710 96     Resp 05/30/22 1710 16     Temp 05/30/22 1710 98.9 F (37.2 C)     Temp Source 05/30/22 1710 Oral     SpO2 05/30/22 1710 96 %     Weight --      Height --      Head Circumference --      Peak Flow --      Pain Score 05/30/22 1711 0     Pain Loc --      Pain Edu? --      Excl. in GC? --    No data  found.  Updated Vital Signs BP 125/84 (BP Location: Right Arm)   Pulse 96   Temp 98.9 F (37.2 C) (Oral)   Resp 16   LMP 05/19/2022   SpO2 96%   Visual Acuity Right Eye Distance:   Left Eye Distance:   Bilateral Distance:    Right Eye Near:   Left Eye Near:    Bilateral Near:     Physical Exam Vitals and nursing note reviewed.  Constitutional:      Appearance: Normal appearance.  HENT:     Head: Atraumatic.     Right Ear: Tympanic membrane and external ear normal.     Left Ear: Tympanic membrane and external ear normal.     Nose: Rhinorrhea present.     Mouth/Throat:     Mouth: Mucous membranes are moist.     Pharynx: Posterior oropharyngeal erythema present.  Eyes:     Extraocular Movements: Extraocular movements intact.     Conjunctiva/sclera: Conjunctivae normal.  Cardiovascular:     Rate and Rhythm: Normal rate and regular rhythm.     Heart sounds: Normal heart sounds.  Pulmonary:     Effort: Pulmonary effort is normal.     Breath sounds: Normal breath sounds. No wheezing or rales.  Musculoskeletal:        General: Normal range of motion.     Cervical back: Normal range of motion and neck supple.  Skin:    General: Skin is warm and dry.  Neurological:     Mental Status: She is alert and oriented to person, place, and time.  Psychiatric:        Mood and Affect: Mood normal.        Thought Content: Thought content normal.      UC Treatments / Results  Labs (all labs ordered are listed, but only abnormal results are displayed) Labs Reviewed  RESP PANEL BY RT-PCR (FLU A&B, COVID) [id number]    EKG   Radiology No results found.  Procedures Procedures (including critical care time)  Medications Ordered in UC Medications - No data to display  Initial Impression / Assessment and Plan / UC Course  I have reviewed the triage vital signs and the nursing notes.  Pertinent labs & imaging results that were available during my care of the patient were  reviewed  by me and considered in my medical decision making (see chart for details).     Suspect viral upper respiratory infection, treat with Phenergan DM, Flonase, over-the-counter cold and congestion medications and supportive home care.  Respiratory panel pending.  Work note given.  Return for any worsening symptoms.  Final Clinical Impressions(s) / UC Diagnoses   Final diagnoses:  Viral URI with cough   Discharge Instructions   None    ED Prescriptions     Medication Sig Dispense Auth. Provider   promethazine-dextromethorphan (PROMETHAZINE-DM) 6.25-15 MG/5ML syrup Take 5 mLs by mouth 4 (four) times daily as needed. 100 mL Particia Nearing, PA-C   fluticasone [facility]) 50 MCG/ACT nasal spray Place 1 spray into both nostrils 2 (two) times daily. 16 g Particia Nearing, [place]      PDMP not reviewed this encounter.   Particia Nearing, [place] 05/30/22 1752

## 2022-05-30 NOTE — ED Triage Notes (Signed)
Cough, headache, drainage down throat that started yesterday. Taking mucinex and dyquill.

## 2022-06-09 ENCOUNTER — Telehealth: Payer: BC Managed Care – PPO | Admitting: Physician Assistant

## 2022-06-09 DIAGNOSIS — J019 Acute sinusitis, unspecified: Secondary | ICD-10-CM

## 2022-06-09 DIAGNOSIS — B9689 Other specified bacterial agents as the cause of diseases classified elsewhere: Secondary | ICD-10-CM

## 2022-06-10 MED ORDER — AMOXICILLIN-POT CLAVULANATE 875-125 MG PO TABS: 1.0000 | ORAL_TABLET | Freq: Two times a day (BID) | ORAL | 0 refills | Status: DC

## 2022-06-10 NOTE — Progress Notes (Signed)

## 2022-06-10 NOTE — Progress Notes (Signed)
I have spent 5 minutes in review of e-visit questionnaire, review and updating patient chart, medical decision making and response to patient.   Adya Wirz Cody Chaneka Trefz, PA-C    

## 2022-06-26 ENCOUNTER — Ambulatory Visit
Admission: EM | Admit: 2022-06-26 | Discharge: 2022-06-26 | Disposition: A | Discharge status: Home / Self Care | Payer: BC Managed Care – PPO | Attending: Nurse Practitioner | Admitting: Nurse Practitioner

## 2022-06-26 DIAGNOSIS — N3000 Acute cystitis without hematuria: Secondary | ICD-10-CM | POA: Insufficient documentation

## 2022-06-26 LAB — POCT URINALYSIS DIP (MANUAL ENTRY)
Glucose, UA: 250 mg/dL — AB
Nitrite, UA: POSITIVE — AB
Protein Ur, POC: >=300 mg/dL — AB
Spec Grav, UA: 1.02 (ref 1.010–1.025)
Urobilinogen, UA: >=8 E.U./dL — AB
pH, UA: 5 (ref 5.0–8.0)

## 2022-06-26 MED ORDER — CEPHALEXIN 500 MG PO CAPS: 500.0000 mg | ORAL_CAPSULE | Freq: Four times a day (QID) | ORAL | 0 refills | Status: AC

## 2022-06-26 NOTE — ED Triage Notes (Signed)
Pt reports increase urinary frequency and burning when urinating x 2 days. Taking AZO.

## 2022-06-26 NOTE — ED Provider Notes (Signed)
[facility]    CSN: [id number] Arrival date & time: 06/26/22  1424      History   Chief Complaint Chief Complaint  Patient presents with   Dysuria    HPI Alison Wells is a 31 y.o. female.   Patient presents today for 2 days of burning with urination, increased urinary frequency and urgency, and voiding small amounts.  Also notices her urinary smell is more odorous than normal.  No abdominal pain, suprapubic pain, new back pain or flank pain, fever, or nausea/vomiting.  Reports she has a history of recurrent urinary tract infection.  Last was approximately 3 months ago, she was treated initially with Bactrim but then had to be switched to Macrobid based on the urine culture.  Prior to that, was treated with ciprofloxacin.  She is taking Azo for symptoms which helps minimally.      Past Medical History:  Diagnosis Date   Medical history non-contributory     Patient Active Problem List   Diagnosis Date Noted   Previous cesarean section 07/11/2018   Postpartum care following cesarean delivery (12/29) 07/11/2018   Status post repeat low transverse cesarean section 07/11/2018   Pyelonephritis affecting pregnancy 05/15/2018    Past Surgical History:  Procedure Laterality Date   CESAREAN SECTION N/A 12/09/2014   Procedure: CESAREAN SECTION;  Surgeon: Shea Evans, MD;  Location: [facility];  Service: Obstetrics;  Laterality: N/A;   CESAREAN SECTION N/A 07/11/2018   Procedure: Repeat CESAREAN SECTION;  Surgeon: Shea Evans, MD;  Location: [facility];  Service: Obstetrics;  Laterality: N/A;  EDD: 07/17/18   TONSILLECTOMY      OB History     Gravida  2   Para  2   Term  2   Preterm      AB      Living  2      SAB      IAB      Ectopic      Multiple  0   Live Births  2            Home Medications    Prior to Admission medications   Medication Sig Start Date End Date Taking? Authorizing Provider  cephALEXin (KEFLEX) 500 MG  capsule Take 1 capsule (500 mg total) by mouth every 6 (six) hours for 5 days. 06/26/22 07/01/22 Yes Valentino Nose, NP  phenazopyridine (PYRIDIUM) 95 MG tablet Take 95 mg by mouth 3 (three) times daily as needed for pain.   Yes [provider]  carbamide peroxide (DEBROX) 6.5 % OTIC solution Place 5 drops into the right ear 2 (two) times daily. 02/04/22   Particia Nearing, PA-C  ferrous sulfate 325 (65 FE) MG EC tablet Take 325 mg by mouth daily with breakfast.    [provider]  fluticasone (FLONASE) 50 MCG/ACT nasal spray Place 1 spray into both nostrils 2 (two) times daily. 05/30/22   Particia Nearing, PA-C  ibuprofen (ADVIL,MOTRIN) 600 MG tablet Take 1 tablet (600 mg total) by mouth every 6 (six) hours as needed for mild pain or moderate pain. 07/13/18   Sigmon, Scarlette Slice, CNM  Nutritional Supplements (JUICE PLUS FIBRE PO) Take 2 tablets by mouth daily.    [provider]  pantoprazole (PROTONIX) 20 MG tablet Take 20 mg by mouth daily as needed for heartburn.  04/26/18   [provider]  Prenatal MV-Min-Fe Fum-FA-DHA [facility] PRENATAL EARLY SHIELD PO) Take 1 tablet by mouth daily.  [provider]    Family History Family History  Problem Relation Age of Onset   Hypertension Mother    Diabetes Father    Hypertension Father     Social History Social History   Tobacco Use   Smoking status: Never   Smokeless tobacco: Never  Vaping Use   Vaping Use: Never used  Substance Use Topics   Alcohol use: Yes    Comment: Occas   Drug use: No     Allergies   Patient has no known allergies.   Review of Systems Review of Systems Per HPI  Physical Exam Triage Vital Signs ED Triage Vitals  Enc Vitals Group     BP 06/26/22 1514 123/70     Pulse Rate 06/26/22 1514 91     Resp 06/26/22 1514 16     Temp 06/26/22 1514 98.5 F (36.9 C)     Temp Source 06/26/22 1514 Oral     SpO2 06/26/22 1514 95 %     Weight --       Height --      Head Circumference --      Peak Flow --      Pain Score 06/26/22 1516 0     Pain Loc --      Pain Edu? --      Excl. in GC? --    No data found.  Updated Vital Signs BP 123/70 (BP Location: Right Arm)   Pulse 91   Temp 98.5 F (36.9 C) (Oral)   Resp 16   LMP  (Within Weeks) Comment: 2 weeks  SpO2 95%   Visual Acuity Right Eye Distance:   Left Eye Distance:   Bilateral Distance:    Right Eye Near:   Left Eye Near:    Bilateral Near:     Physical Exam Vitals and nursing note reviewed.  Constitutional:      General: She is not in acute distress.    Appearance: She is not toxic-appearing.  Pulmonary:     Effort: Pulmonary effort is normal. No respiratory distress.  Abdominal:     General: Abdomen is flat. There is no distension.  Skin:    Coloration: Skin is not jaundiced or pale.     Findings: No erythema.  Neurological:     Mental Status: She is alert and oriented to person, place, and time.     Gait: Gait normal.  Psychiatric:        Behavior: Behavior is cooperative.      UC Treatments / Results  Labs (all labs ordered are listed, but only abnormal results are displayed) Labs Reviewed  POCT URINALYSIS DIP (MANUAL ENTRY) - Abnormal; Notable for the following components:      Result Value   Color, UA orange (*)    Clarity, UA hazy (*)    Glucose, UA =250 (*)    Bilirubin, UA moderate (*)    Ketones, POC UA small (15) (*)    Blood, UA trace-intact (*)    Protein Ur, POC >=300 (*)    Urobilinogen, UA >=8.0 (*)    Nitrite, UA Positive (*)    Leukocytes, UA Large (3+) (*)    All other components within normal limits  URINE CULTURE    EKG   Radiology No results found.  Procedures Procedures (including critical care time)  Medications Ordered in UC Medications - No data to display  Initial Impression / Assessment and Plan / UC Course  I have reviewed the triage  vital signs and the nursing notes.  Pertinent labs & imaging  results that were available during my care of the patient were reviewed by me and considered in my medical decision making (see chart for details).   Patient is well-appearing, normotensive, afebrile, not tachycardic, not tachypneic, oxygenating well on room air.    Acute cystitis without hematuria Urinalysis today unreliable secondary to Azo use Urine culture pending In meantime, treat with Keflex 4 times a day for 5 days given history of recurrence Recommended follow-up with urologist and contact information given ER and return precautions discussed Note given for work  The patient was given the opportunity to ask questions.  All questions answered to their satisfaction.  The patient is in agreement to this plan.    Final Clinical Impressions(s) / UC Diagnoses   Final diagnoses:  Acute cystitis without hematuria     Discharge Instructions      I think you have a UTI.  Please take the Keflex four times daily for 5 days to treat it.  We have sent a urine culture and will call you early next week if you have change antibiotic.  You can continue the Azo.     ED Prescriptions     Medication Sig Dispense Auth. Provider   cephALEXin (KEFLEX) 500 MG capsule Take 1 capsule (500 mg total) by mouth every 6 (six) hours for 5 days. 20 capsule Eulogio Bear, NP      PDMP not reviewed this encounter.   Eulogio Bear, NP 06/26/22 [phone]

## 2022-06-26 NOTE — Discharge Instructions (Signed)
I think you have a UTI.  Please take the Keflex four times daily for 5 days to treat it.  We have sent a urine culture and will call you early next week if you have change antibiotic.  You can continue the Azo.

## 2022-06-27 LAB — URINE CULTURE

## 2022-06-28 LAB — URINE CULTURE: Culture: >=100000 — AB

## 2022-06-29 LAB — URINE CULTURE

## 2022-07-08 ENCOUNTER — Telehealth: Payer: BC Managed Care – PPO | Admitting: Nurse Practitioner

## 2022-07-08 DIAGNOSIS — J069 Acute upper respiratory infection, unspecified: Secondary | ICD-10-CM

## 2022-07-08 MED ORDER — FLUTICASONE PROPIONATE 50 MCG/ACT NA SUSP: 2.0000 | Freq: Every day | NASAL | 6 refills | Status: DC

## 2022-07-08 NOTE — Progress Notes (Signed)
E-Visit for Sinus Problems  We are sorry that you are not feeling well.  Here is how we plan to help!  Since you got better and then symptoms returned and have only been present for the past few days it is likely that you caught another viral infection that is causing your symptoms.   Providers prescribe antibiotics to treat infections caused by bacteria. Antibiotics are very powerful in treating bacterial infections when they are used properly. To maintain their effectiveness, they should be used only when necessary. Overuse of antibiotics has resulted in the development of superbugs that are resistant to treatment!    After careful review of your answers, I would not recommend an antibiotic for your condition.  Antibiotics are not effective against viruses and therefore should not be used to treat them. Common examples of infections caused by viruses include colds and flu   Based on what you have shared with me it looks like you have sinusitis.  Sinusitis is inflammation and infection in the sinus cavities of the head.  Based on your presentation I believe you most likely have Acute Viral Sinusitis.This is an infection most likely caused by a virus. There is not specific treatment for viral sinusitis other than to help you with the symptoms until the infection runs its course.  You may use an oral decongestant such as Mucinex D or if you have glaucoma or high blood pressure use plain Mucinex. Saline nasal spray help and can safely be used as often as needed for congestion, I have prescribed: Fluticasone nasal spray two sprays in each nostril once a day  Some authorities believe that zinc sprays or the use of Echinacea may shorten the course of your symptoms.  Sinus infections are not as easily transmitted as other respiratory infection, however we still recommend that you avoid close contact with loved ones, especially the very young and elderly.  Remember to wash your hands thoroughly throughout the  day as this is the number one way to prevent the spread of infection!  Home Care: Only take medications as instructed by your medical team. Do not take these medications with alcohol. A steam or ultrasonic humidifier can help congestion.  You can place a towel over your head and breathe in the steam from hot water coming from a faucet. Avoid close contacts especially the very young and the elderly. Cover your mouth when you cough or sneeze. Always remember to wash your hands.  Get Help Right Away If: You develop worsening fever or sinus pain. You develop a severe head ache or visual changes. Your symptoms persist after you have completed your treatment plan.  Make sure you Understand these instructions. Will watch your condition. Will get help right away if you are not doing well or get worse.   Thank you for choosing an e-visit.  Your e-visit answers were reviewed by a board certified advanced clinical practitioner to complete your personal care plan. Depending upon the condition, your plan could have included both over the counter or prescription medications.  Please review your pharmacy choice. Make sure the pharmacy is open so you can pick up prescription now. If there is a problem, you may contact your provider through [employer] and have the prescription routed to another pharmacy.  Your safety is important to [place]. If you have drug allergies check your prescription carefully.   For the next 24 hours you can use MyChart to ask questions about today's visit, request a non-urgent call back, or  ask for a work or school excuse. You will get an email in the next two days asking about your experience. I hope that your e-visit has been valuable and will speed your recovery.  Meds ordered this encounter  Medications   fluticasone (FLONASE) 50 MCG/ACT nasal spray    Sig: Place 2 sprays into both nostrils daily.    Dispense:  16 g    Refill:  6     I spent approximately 7  minutes reviewing the patient's history, current symptoms and coordinating their plan of care today.

## 2022-07-31 ENCOUNTER — Ambulatory Visit
Admission: EM | Admit: 2022-07-31 | Discharge: 2022-07-31 | Disposition: A | Discharge status: Home / Self Care | Payer: BC Managed Care – PPO | Attending: Family Medicine | Admitting: Family Medicine

## 2022-07-31 DIAGNOSIS — N3001 Acute cystitis with hematuria: Secondary | ICD-10-CM | POA: Diagnosis present

## 2022-07-31 HISTORY — DX: Urinary tract infection, site not specified: N39.0

## 2022-07-31 LAB — POCT URINALYSIS DIP (MANUAL ENTRY)
Bilirubin, UA: NEGATIVE
Glucose, UA: NEGATIVE mg/dL
Nitrite, UA: POSITIVE — AB
Spec Grav, UA: >=1.03 — AB (ref 1.010–1.025)
Urobilinogen, UA: 1 E.U./dL
pH, UA: 5.5 (ref 5.0–8.0)

## 2022-07-31 MED ORDER — NITROFURANTOIN MONOHYD MACRO 100 MG PO CAPS: 100.0000 mg | ORAL_CAPSULE | Freq: Two times a day (BID) | ORAL | 0 refills | Status: AC

## 2022-07-31 NOTE — ED Provider Notes (Signed)
[facility]    CSN: [id number] Arrival date & time: 07/31/22  1209      History   Chief Complaint Chief Complaint  Patient presents with   Urinary Tract Infection    HPI Alison Wells is a 32 y.o. female.   Patient presents today for 3-day history of dysuria, urinary frequency and urgency, voiding smaller amounts, foul urinary odor, lower abdominal pain/pressure, headache, chills and bodyaches.  She denies urinary incontinence, hematuria, new back pain, flank pain, nausea or vomiting.  No vaginal discharge.  Reports this is her third UTI in the past 6 months.  She has tried to call urology, however has been unable to schedule an appointment so far.  Reports she does urinate after sexual activity, does not wear thong underwear, and wears cotton underwear.  She tries to practice good vaginal hygiene.  Azo helps with urinary symptoms, last dose was last night.  Review of chart shows last urine culture 1 month ago was resistant to ampicillin, ciprofloxacin, Zosyn, and Bactrim.  At that time, she was treated with Keflex.  Prior to that, in September 2023, urine culture was resistant to ampicillin, ciprofloxacin, and Bactrim.  She was treated with Macrobid then.  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    Past Medical History:  Diagnosis Date   Medical history non-contributory    UTI (urinary tract infection)     Patient Active Problem List   Diagnosis Date Noted   Previous cesarean section 07/11/2018   Postpartum care following cesarean delivery (12/29) 07/11/2018   Status post repeat low transverse cesarean section 07/11/2018   Pyelonephritis affecting pregnancy 05/15/2018    Past Surgical History:  Procedure Laterality Date   CESAREAN SECTION N/A 12/09/2014   Procedure: CESAREAN SECTION;  Surgeon: Azucena Fallen, MD;  Location: [facility];  Service: Obstetrics;  Laterality: N/A;   CESAREAN SECTION N/A 07/11/2018   Procedure: Repeat CESAREAN  SECTION;  Surgeon: Azucena Fallen, MD;  Location: [place];  Service: Obstetrics;  Laterality: N/A;  EDD: 07/17/18   TONSILLECTOMY      OB History     Gravida  2   Para  2   Term  2   Preterm      AB      Living  2      SAB      IAB      Ectopic      Multiple  0   Live Births  2            Home Medications    Prior to Admission medications   Medication Sig Start Date End Date Taking? Authorizing Provider  nitrofurantoin, macrocrystal-monohydrate, (MACROBID) 100 MG capsule Take 1 capsule (100 mg total) by mouth 2 (two) times daily for 5 days. 07/31/22 08/05/22 Yes Eulogio Bear, NP  carbamide peroxide (DEBROX) 6.5 % OTIC solution Place 5 drops into the right ear 2 (two) times daily. 02/04/22   Volney American, PA-C  ferrous sulfate 325 (65 FE) MG EC tablet Take 325 mg by mouth daily with breakfast.    [provider]  fluticasone (FLONASE) 50 MCG/ACT nasal spray Place 1 spray into both nostrils 2 (two) times daily. 05/30/22   Volney American, PA-C  fluticasone [facility]) 50 MCG/ACT nasal spray Place 2 sprays into both nostrils daily. 07/08/22   Apolonio Schneiders, FNP  ibuprofen (ADVIL,MOTRIN) 600 MG tablet Take 1 tablet (600 mg total) by mouth every 6 (  six) hours as needed for mild pain or moderate pain. 07/13/18   Sigmon, Scarlette Slice, CNM  Nutritional Supplements (JUICE PLUS FIBRE PO) Take 2 tablets by mouth daily.    [provider]  pantoprazole (PROTONIX) 20 MG tablet Take 20 mg by mouth daily as needed for heartburn.  04/26/18   [provider]  phenazopyridine (PYRIDIUM) 95 MG tablet Take 95 mg by mouth 3 (three) times daily as needed for pain.    [provider]  Prenatal MV-Min-Fe Fum-FA-DHA [facility] PRENATAL EARLY SHIELD PO) Take 1 tablet by mouth daily.    [provider]    Family History Family History  Problem Relation Age of Onset   Hypertension Mother    Diabetes Father     Hypertension Father     Social History Social History   Tobacco Use   Smoking status: Never   Smokeless tobacco: Never  Vaping Use   Vaping Use: Never used  Substance Use Topics   Alcohol use: Yes    Comment: Occas   Drug use: No     Allergies   Patient has no known allergies.   Review of Systems Review of Systems Per HPI  Physical Exam Triage Vital Signs ED Triage Vitals  Enc Vitals Group     BP 07/31/22 1239 105/74     Pulse Rate 07/31/22 1239 76     Resp 07/31/22 1239 20     Temp 07/31/22 1239 98 F (36.7 C)     Temp Source 07/31/22 1239 Oral     SpO2 07/31/22 1239 96 %     Weight --      Height --      Head Circumference --      Peak Flow --      Pain Score 07/31/22 1241 4     Pain Loc --      Pain Edu? --      Excl. in GC? --    No data found.  Updated Vital Signs BP 105/74 (BP Location: Right Arm)   Pulse 76   Temp 98 F (36.7 C) (Oral)   Resp 20   LMP 07/16/2022 (Exact Date)   SpO2 96%   Visual Acuity Right Eye Distance:   Left Eye Distance:   Bilateral Distance:    Right Eye Near:   Left Eye Near:    Bilateral Near:     Physical Exam Vitals and nursing note reviewed.  Constitutional:      General: She is not in acute distress.    Appearance: She is not toxic-appearing.  Pulmonary:     Effort: Pulmonary effort is normal. No respiratory distress.  Abdominal:     General: Abdomen is flat. Bowel sounds are normal. There is no distension.     Palpations: Abdomen is soft. There is no mass.     Tenderness: There is no abdominal tenderness. There is no right CVA tenderness, left CVA tenderness or guarding.  Skin:    General: Skin is warm and dry.     Capillary Refill: Capillary refill takes less than 2 seconds.     Coloration: Skin is not jaundiced or pale.     Findings: No erythema.  Neurological:     Mental Status: She is alert and oriented to person, place, and time.     Motor: No weakness.     Gait: Gait normal.  Psychiatric:         Behavior: Behavior is cooperative.  UC Treatments / Results  Labs (all labs ordered are listed, but only abnormal results are displayed) Labs Reviewed  POCT URINALYSIS DIP (MANUAL ENTRY) - Abnormal; Notable for the following components:      Result Value   Clarity, UA cloudy (*)    Ketones, POC UA trace (5) (*)    Spec Grav, UA >=1.030 (*)    Blood, UA moderate (*)    Protein Ur, POC trace (*)    Nitrite, UA Positive (*)    Leukocytes, UA Small (1+) (*)    All other components within normal limits  URINE CULTURE    EKG   Radiology No results found.  Procedures Procedures (including critical care time)  Medications Ordered in UC Medications - No data to display  Initial Impression / Assessment and Plan / UC Course  I have reviewed the triage vital signs and the nursing notes.  Pertinent labs & imaging results that were available during my care of the patient were reviewed by me and considered in my medical decision making (see chart for details).   Patient is well-appearing, normotensive, afebrile, not tachycardic, not tachypneic, oxygenating well on room air.    Acute cystitis with hematuria Will treat with Macrobid twice daily for 5 days Urine culture pending Supportive care discussed Strict ER and return precautions discussed Contact info given for urology-needs to follow-up with them for recurrent UTI  The patient was given the opportunity to ask questions.  All questions answered to their satisfaction.  The patient is in agreement to this plan.    Final Clinical Impressions(s) / UC Diagnoses   Final diagnoses:  Acute cystitis with hematuria     Discharge Instructions      It looks like you have a urinary tract infection today.  Please take the Macrobid as prescribed to treat it.  You can continue the Pyridium as needed for bladder pain.  We will call you in a couple of days if the urine culture shows we need to change the  antibiotic.  D-mannose is a supplement we talked about; you can try taking this daily to help prevent UTI.  Please make it a priority to schedule a follow-up with a urologist.    ED Prescriptions     Medication Sig Dispense Auth. Provider   nitrofurantoin, macrocrystal-monohydrate, (MACROBID) 100 MG capsule Take 1 capsule (100 mg total) by mouth 2 (two) times daily for 5 days. 10 capsule Eulogio Bear, NP      PDMP not reviewed this encounter.   Eulogio Bear, NP 07/31/22 1345

## 2022-07-31 NOTE — ED Triage Notes (Signed)
Pt reports frequency urination, discomfort when she urinates, some low abdominal pain, headache x 3 days. Ok azo which gave some relief.

## 2022-07-31 NOTE — Discharge Instructions (Signed)
It looks like you have a urinary tract infection today.  Please take the Macrobid as prescribed to treat it.  You can continue the Pyridium as needed for bladder pain.  We will call you in a couple of days if the urine culture shows we need to change the antibiotic.  D-mannose is a supplement we talked about; you can try taking this daily to help prevent UTI.  Please make it a priority to schedule a follow-up with a urologist.

## 2022-08-02 LAB — URINE CULTURE: Culture: >=100000 — AB

## 2022-08-12 ENCOUNTER — Encounter: Payer: BC Managed Care – PPO | Admitting: Nurse Practitioner

## 2022-08-12 NOTE — Progress Notes (Signed)
Because you have had recent UTIs we recommend an in person visit for repeat cultures, I feel your condition warrants further evaluation and I recommend that you be seen for a face to face visit.  Please contact your primary care physician practice to be seen. Many offices offer virtual options to be seen via video if you are not comfortable going in person to a medical facility at this time.  NOTE: You will NOT be charged for this eVisit.  If you do not have a PCP, [facility] offers a free physician referral service available at [phone]. Our trained staff has the experience, knowledge and resources to put you in touch with a physician who is right for you.    If you are having a true medical emergency please call 911.   Your e-visit answers were reviewed by a board certified advanced clinical practitioner to complete your personal care plan.  Thank you for using e-Visits.

## 2022-09-10 ENCOUNTER — Encounter: Payer: Self-pay | Admitting: Urology

## 2022-09-10 ENCOUNTER — Ambulatory Visit: Payer: BC Managed Care – PPO | Admitting: Urology

## 2022-09-10 DIAGNOSIS — N390 Urinary tract infection, site not specified: Secondary | ICD-10-CM

## 2022-09-10 DIAGNOSIS — Z87440 Personal history of urinary (tract) infections: Secondary | ICD-10-CM | POA: Diagnosis not present

## 2022-09-10 LAB — BLADDER SCAN AMB NON-IMAGING: Scan Result: 12

## 2022-09-10 MED ORDER — NITROFURANTOIN MONOHYD MACRO 100 MG PO CAPS: 100.0000 mg | ORAL_CAPSULE | Freq: Two times a day (BID) | ORAL | 0 refills | Status: DC

## 2022-09-10 MED ORDER — FLUCONAZOLE 150 MG PO TABS: 150.0000 mg | ORAL_TABLET | Freq: Every day | ORAL | 0 refills | Status: DC

## 2022-09-10 MED ORDER — NITROFURANTOIN MACROCRYSTAL 50 MG PO CAPS: 50.0000 mg | ORAL_CAPSULE | ORAL | 3 refills | Status: DC | PRN

## 2022-09-10 NOTE — Progress Notes (Unsigned)
09/10/2022 3:22 PM   Alison Wells [birth year] [id number]  Referring provider: Azucena Fallen, MD [address] [place],  [place] [address]  Recurrent UTI   HPI: [name] is a 32yo here for evaluation of recurrent UTI. She gets a UTI every 1-2 months. Her last 4 cultures have been positive for e coli. UA today is concerning for infection. 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 She has been having UTis since high school. She has intermittent issues with constipation. UA today is concerning for infection.    PMH: Past Medical History:  Diagnosis Date   Medical history non-contributory    UTI (urinary tract infection)     Surgical History: Past Surgical History:  Procedure Laterality Date   CESAREAN SECTION N/A 12/09/2014   Procedure: CESAREAN SECTION;  Surgeon: Azucena Fallen, MD;  Location: [facility];  Service: Obstetrics;  Laterality: N/A;   CESAREAN SECTION N/A 07/11/2018   Procedure: Repeat CESAREAN SECTION;  Surgeon: Azucena Fallen, MD;  Location: [facility];  Service: Obstetrics;  Laterality: N/A;  EDD: 07/17/18   TONSILLECTOMY      Home Medications:  Allergies as of 09/10/2022   No Known Allergies      Medication List        Accurate as of September 10, 2022  3:22 PM. If you have any questions, ask your nurse or doctor.          carbamide peroxide 6.5 % OTIC solution Commonly known as: DEBROX Place 5 drops into the right ear 2 (two) times daily.   ferrous sulfate 325 (65 FE) MG EC tablet Take 325 mg by mouth daily with breakfast.   fluticasone 50 MCG/ACT nasal spray Commonly known as: FLONASE Place 1 spray into both nostrils 2 (two) times daily.   fluticasone 50 MCG/ACT nasal spray Commonly known as: FLONASE Place 2 sprays into both nostrils daily.   ibuprofen 600 MG tablet Commonly known as: ADVIL Take 1 tablet (600 mg total) by mouth every 6 (six) hours as needed for mild pain or moderate pain.   JUICE PLUS FIBRE  PO Take 2 tablets by mouth daily.   pantoprazole 20 MG tablet Commonly known as: PROTONIX Take 20 mg by mouth daily as needed for heartburn.   phenazopyridine 95 MG tablet Commonly known as: PYRIDIUM Take 95 mg by mouth 3 (three) times daily as needed for pain.   SIMILAC PRENATAL EARLY SHIELD PO Take 1 tablet by mouth daily.        Allergies: No Known Allergies  Family History: Family History  Problem Relation Age of Onset   Hypertension Mother    Diabetes Father    Hypertension Father     Social History:  reports that she has never smoked. She has never used smokeless tobacco. She reports current alcohol use. She reports that she does not use drugs.  ROS: All other review of systems were reviewed and are negative except what is noted above in HPI  Physical Exam: There were no vitals taken for this visit.  Constitutional:  Alert and oriented, No acute distress. HEENT: [place] AT, moist mucus membranes.  Trachea midline, no masses. Cardiovascular: No clubbing, cyanosis, or edema. Respiratory: Normal respiratory effort, no increased work of breathing. GI: Abdomen is soft, nontender, nondistended, no abdominal masses GU: No CVA tenderness.  Lymph: No cervical or inguinal lymphadenopathy. Skin: No rashes, bruises or suspicious lesions. Neurologic: Grossly intact, no focal deficits, moving all 4 extremities. Psychiatric: Normal mood and affect.  Laboratory Data: Lab Results  Component Value Date   WBC 10.4 07/12/2018   HGB 10.3 (L) 07/12/2018   HCT 31.5 (L) 07/12/2018   MCV 90.8 07/12/2018   PLT 217 07/12/2018    Lab Results  Component Value Date   CREATININE 0.64 12/09/2014    No results found for: "PSA"  No results found for: "TESTOSTERONE"  No results found for: "HGBA1C"  Urinalysis    Component Value Date/Time   COLORURINE YELLOW 05/15/2018 1447   APPEARANCEUR CLOUDY (A) 05/15/2018 1447   LABSPEC 1.012 05/15/2018 1447   PHURINE 8.0 05/15/2018 1447    GLUCOSEU NEGATIVE 05/15/2018 1447   HGBUR NEGATIVE 05/15/2018 1447   BILIRUBINUR negative 07/31/2022 1230   KETONESUR trace (5) (A) 07/31/2022 1230   KETONESUR 5 (A) 05/15/2018 1447   PROTEINUR trace (A) 07/31/2022 1230   PROTEINUR 100 (A) 05/15/2018 1447   UROBILINOGEN 1.0 07/31/2022 1230   UROBILINOGEN 0.2 05/23/2013 0722   NITRITE Positive (A) 07/31/2022 1230   NITRITE POSITIVE (A) 05/15/2018 1447   LEUKOCYTESUR Small (1+) (A) 07/31/2022 1230    Lab Results  Component Value Date   BACTERIA MANY (A) 05/15/2018    Pertinent Imaging: *** No results found for this or any previous visit.  No results found for this or any previous visit.  No results found for this or any previous visit.  No results found for this or any previous visit.  No results found for this or any previous visit.  No valid procedures specified. No results found for this or any previous visit.  No results found for this or any previous visit.   Assessment & Plan:    1. Recurrent UTI *** - Urinalysis, Routine w reflex microscopic - BLADDER SCAN AMB NON-IMAGING   No follow-ups on file.  Nicolette Bang, MD  [facility] [place]

## 2022-09-10 NOTE — Progress Notes (Unsigned)
post void residual=12

## 2022-09-11 ENCOUNTER — Encounter: Payer: Self-pay | Admitting: Urology

## 2022-09-11 LAB — URINALYSIS, ROUTINE W REFLEX MICROSCOPIC
Bilirubin, UA: NEGATIVE
Glucose, UA: NEGATIVE
Nitrite, UA: POSITIVE — AB
Specific Gravity, UA: 1.02 (ref 1.005–1.030)
Urobilinogen, Ur: 4 mg/dL — ABNORMAL HIGH (ref 0.2–1.0)
pH, UA: 7 (ref 5.0–7.5)

## 2022-09-11 LAB — MICROSCOPIC EXAMINATION

## 2022-09-11 NOTE — Patient Instructions (Signed)
Urinary Tract Infection, Adult  A urinary tract infection (UTI) is an infection of any part of the urinary tract. The urinary tract includes the kidneys, ureters, bladder, and urethra. These organs make, store, and get rid of urine in the body. An upper UTI affects the ureters and kidneys. A lower UTI affects the bladder and urethra. What are the causes? Most urinary tract infections are caused by bacteria in your genital area around your urethra, where urine leaves your body. These bacteria grow and cause inflammation of your urinary tract. What increases the risk? You are more likely to develop this condition if: You have a urinary catheter that stays in place. You are not able to control when you urinate or have a bowel movement (incontinence). You are female and you: Use a spermicide or diaphragm for birth control. Have low estrogen levels. Are pregnant. You have certain genes that increase your risk. You are sexually active. You take antibiotic medicines. You have a condition that causes your flow of urine to slow down, such as: An enlarged prostate, if you are female. Blockage in your urethra. A kidney stone. A nerve condition that affects your bladder control (neurogenic bladder). Not getting enough to drink, or not urinating often. You have certain medical conditions, such as: Diabetes. A weak disease-fighting system (immunesystem). Sickle cell disease. Gout. Spinal cord injury. What are the signs or symptoms? Symptoms of this condition include: Needing to urinate right away (urgency). Frequent urination. This may include small amounts of urine each time you urinate. Pain or burning with urination. Blood in the urine. Urine that smells bad or unusual. Trouble urinating. Cloudy urine. Vaginal discharge, if you are female. Pain in the abdomen or the lower back. You may also have: Vomiting or a decreased appetite. Confusion. Irritability or tiredness. A fever or  chills. Diarrhea. The first symptom in older adults may be confusion. In some cases, they may not have any symptoms until the infection has worsened. How is this diagnosed? This condition is diagnosed based on your medical history and a physical exam. You may also have other tests, including: Urine tests. Blood tests. Tests for STIs (sexually transmitted infections). If you have had more than one UTI, a cystoscopy or imaging studies may be done to determine the cause of the infections. How is this treated? Treatment for this condition includes: Antibiotic medicine. Over-the-counter medicines to treat discomfort. Drinking enough water to stay hydrated. If you have frequent infections or have other conditions such as a kidney stone, you may need to see a health care provider who specializes in the urinary tract (urologist). In rare cases, urinary tract infections can cause sepsis. Sepsis is a life-threatening condition that occurs when the body responds to an infection. Sepsis is treated in the hospital with IV antibiotics, fluids, and other medicines. Follow these instructions at home:  Medicines Take over-the-counter and prescription medicines only as told by your health care provider. If you were prescribed an antibiotic medicine, take it as told by your health care provider. Do not stop using the antibiotic even if you start to feel better. General instructions Make sure you: Empty your bladder often and completely. Do not hold urine for long periods of time. Empty your bladder after sex. Wipe from front to back after urinating or having a bowel movement if you are female. Use each tissue only one time when you wipe. Drink enough fluid to keep your urine pale yellow. Keep all follow-up visits. This is important. Contact a health   care provider if: Your symptoms do not get better after 1-2 days. Your symptoms go away and then return. Get help right away if: You have severe pain in  your back or your lower abdomen. You have a fever or chills. You have nausea or vomiting. Summary A urinary tract infection (UTI) is an infection of any part of the urinary tract, which includes the kidneys, ureters, bladder, and urethra. Most urinary tract infections are caused by bacteria in your genital area. Treatment for this condition often includes antibiotic medicines. If you were prescribed an antibiotic medicine, take it as told by your health care provider. Do not stop using the antibiotic even if you start to feel better. Keep all follow-up visits. This is important. This information is not intended to replace advice given to you by your health care provider. Make sure you discuss any questions you have with your health care provider. Document Revised: 02/10/2020 Document Reviewed: 02/10/2020 Elsevier Patient Education  2023 Elsevier Inc.  

## 2022-09-13 LAB — URINE CULTURE

## 2022-11-12 ENCOUNTER — Ambulatory Visit: Payer: BC Managed Care – PPO | Admitting: Urology

## 2022-11-12 VITALS — BP 124/78 | HR 71

## 2022-11-12 DIAGNOSIS — Z09 Encounter for follow-up examination after completed treatment for conditions other than malignant neoplasm: Secondary | ICD-10-CM | POA: Diagnosis not present

## 2022-11-12 DIAGNOSIS — Z87440 Personal history of urinary (tract) infections: Secondary | ICD-10-CM

## 2022-11-12 DIAGNOSIS — N390 Urinary tract infection, site not specified: Secondary | ICD-10-CM

## 2022-11-12 MED ORDER — NITROFURANTOIN MACROCRYSTAL 50 MG PO CAPS: 50.0000 mg | ORAL_CAPSULE | ORAL | 11 refills | Status: DC | PRN

## 2022-11-12 NOTE — Progress Notes (Signed)
11/12/2022 4:13 PM   Alison Wells [birth year] [id number]  Referring provider: Shea Evans, MD [address] [place],  [place] [address]  Followup recurrent UTI   HPI: [name] is a 32yo here for followup for recurrent UTI. No UTIs since last visit. She uses macrodantin PRN. She has not started the miralax yet for her constipation. No worsening LUTS. No other complaints today    PMH: Past Medical History:  Diagnosis Date   Medical history non-contributory    UTI (urinary tract infection)     Surgical History: Past Surgical History:  Procedure Laterality Date   CESAREAN SECTION N/A 12/09/2014   Procedure: CESAREAN SECTION;  Surgeon: Shea Evans, MD;  Location: [facility];  Service: Obstetrics;  Laterality: N/A;   CESAREAN SECTION N/A 07/11/2018   Procedure: Repeat CESAREAN SECTION;  Surgeon: Shea Evans, MD;  Location: [facility];  Service: Obstetrics;  Laterality: N/A;  EDD: 07/17/18   TONSILLECTOMY      Home Medications:  Allergies as of 11/12/2022   No Known Allergies      Medication List        Accurate as of Nov 12, 2022  4:13 PM. If you have any questions, ask your nurse or doctor.          carbamide peroxide 6.5 % OTIC solution Commonly known as: DEBROX Place 5 drops into the right ear 2 (two) times daily.   ferrous sulfate 325 (65 FE) MG EC tablet Take 325 mg by mouth daily with breakfast.   fluconazole 150 MG tablet Commonly known as: DIFLUCAN Take 1 tablet (150 mg total) by mouth daily.   fluticasone 50 MCG/ACT nasal spray Commonly known as: FLONASE Place 1 spray into both nostrils 2 (two) times daily.   fluticasone 50 MCG/ACT nasal spray Commonly known as: FLONASE Place 2 sprays into both nostrils daily.   ibuprofen 600 MG tablet Commonly known as: ADVIL Take 1 tablet (600 mg total) by mouth every 6 (six) hours as needed for mild pain or moderate pain.   JUICE PLUS FIBRE PO Take 2 tablets by mouth daily.   nitrofurantoin  (macrocrystal-monohydrate) 100 MG capsule Commonly known as: MACROBID Take 1 capsule (100 mg total) by mouth every 12 (twelve) hours.   nitrofurantoin 50 MG capsule Commonly known as: Macrodantin Take 1 capsule (50 mg total) by mouth as needed.   pantoprazole 20 MG tablet Commonly known as: PROTONIX Take 20 mg by mouth daily as needed for heartburn.   phenazopyridine 95 MG tablet Commonly known as: PYRIDIUM Take 95 mg by mouth 3 (three) times daily as needed for pain.   SIMILAC PRENATAL EARLY SHIELD PO Take 1 tablet by mouth daily.        Allergies: No Known Allergies  Family History: Family History  Problem Relation Age of Onset   Hypertension Mother    Diabetes Father    Hypertension Father     Social History:  reports that she has never smoked. She has never used smokeless tobacco. She reports current alcohol use. She reports that she does not use drugs.  ROS: All other review of systems were reviewed and are negative except what is noted above in HPI  Physical Exam: BP 124/78   Pulse 71   Constitutional:  Alert and oriented, No acute distress. HEENT:  AT, moist mucus membranes.  Trachea midline, no masses. Cardiovascular: No clubbing, cyanosis, or edema. Respiratory: Normal respiratory effort, no increased work of breathing. GI: Abdomen is soft, nontender, nondistended, no abdominal  masses GU: No CVA tenderness.  Lymph: No cervical or inguinal lymphadenopathy. Skin: No rashes, bruises or suspicious lesions. Neurologic: Grossly intact, no focal deficits, moving all 4 extremities. Psychiatric: Normal mood and affect.  Laboratory Data: Lab Results  Component Value Date   WBC 10.4 07/12/2018   HGB 10.3 (L) 07/12/2018   HCT 31.5 (L) 07/12/2018   MCV 90.8 07/12/2018   PLT 217 07/12/2018    Lab Results  Component Value Date   CREATININE 0.64 12/09/2014    No results found for: "PSA"  No results found for: "TESTOSTERONE"  No results found for:  "HGBA1C"  Urinalysis    Component Value Date/Time   COLORURINE YELLOW 05/15/2018 1447   APPEARANCEUR Cloudy (A) 09/10/2022 1538   LABSPEC 1.012 05/15/2018 1447   PHURINE 8.0 05/15/2018 1447   GLUCOSEU Negative 09/10/2022 1538   HGBUR NEGATIVE 05/15/2018 1447   BILIRUBINUR Negative 09/10/2022 1538   KETONESUR trace (5) (A) 07/31/2022 1230   KETONESUR 5 (A) 05/15/2018 1447   PROTEINUR 1+ (A) 09/10/2022 1538   PROTEINUR 100 (A) 05/15/2018 1447   UROBILINOGEN 1.0 07/31/2022 1230   UROBILINOGEN 0.2 05/23/2013 0722   NITRITE Positive (A) 09/10/2022 1538   NITRITE POSITIVE (A) 05/15/2018 1447   LEUKOCYTESUR 1+ (A) 09/10/2022 1538    Lab Results  Component Value Date   LABMICR See below: 09/10/2022   WBCUA 11-30 (A) 09/10/2022   LABEPIT 0-10 09/10/2022   MUCUS Present (A) 09/10/2022   BACTERIA Many (A) 09/10/2022    Pertinent Imaging:  No results found for this or any previous visit.  No results found for this or any previous visit.  No results found for this or any previous visit.  No results found for this or any previous visit.  No results found for this or any previous visit.  No valid procedures specified. No results found for this or any previous visit.  No results found for this or any previous visit.   Assessment & Plan:    1. Recurrent UTI -continue macrodantin prophylaxis. Followup 3 monthds - Urinalysis, Routine w reflex microscopic   No follow-ups on file.  Wilkie Aye, MD  [facility] [place]

## 2022-11-12 NOTE — Patient Instructions (Signed)
Urinary Tract Infection, Adult  A urinary tract infection (UTI) is an infection of any part of the urinary tract. The urinary tract includes the kidneys, ureters, bladder, and urethra. These organs make, store, and get rid of urine in the body. An upper UTI affects the ureters and kidneys. A lower UTI affects the bladder and urethra. What are the causes? Most urinary tract infections are caused by bacteria in your genital area around your urethra, where urine leaves your body. These bacteria grow and cause inflammation of your urinary tract. What increases the risk? You are more likely to develop this condition if: You have a urinary catheter that stays in place. You are not able to control when you urinate or have a bowel movement (incontinence). You are female and you: Use a spermicide or diaphragm for birth control. Have low estrogen levels. Are pregnant. You have certain genes that increase your risk. You are sexually active. You take antibiotic medicines. You have a condition that causes your flow of urine to slow down, such as: An enlarged prostate, if you are female. Blockage in your urethra. A kidney stone. A nerve condition that affects your bladder control (neurogenic bladder). Not getting enough to drink, or not urinating often. You have certain medical conditions, such as: Diabetes. A weak disease-fighting system (immunesystem). Sickle cell disease. Gout. Spinal cord injury. What are the signs or symptoms? Symptoms of this condition include: Needing to urinate right away (urgency). Frequent urination. This may include small amounts of urine each time you urinate. Pain or burning with urination. Blood in the urine. Urine that smells bad or unusual. Trouble urinating. Cloudy urine. Vaginal discharge, if you are female. Pain in the abdomen or the lower back. You may also have: Vomiting or a decreased appetite. Confusion. Irritability or tiredness. A fever or  chills. Diarrhea. The first symptom in older adults may be confusion. In some cases, they may not have any symptoms until the infection has worsened. How is this diagnosed? This condition is diagnosed based on your medical history and a physical exam. You may also have other tests, including: Urine tests. Blood tests. Tests for STIs (sexually transmitted infections). If you have had more than one UTI, a cystoscopy or imaging studies may be done to determine the cause of the infections. How is this treated? Treatment for this condition includes: Antibiotic medicine. Over-the-counter medicines to treat discomfort. Drinking enough water to stay hydrated. If you have frequent infections or have other conditions such as a kidney stone, you may need to see a health care provider who specializes in the urinary tract (urologist). In rare cases, urinary tract infections can cause sepsis. Sepsis is a life-threatening condition that occurs when the body responds to an infection. Sepsis is treated in the hospital with IV antibiotics, fluids, and other medicines. Follow these instructions at home:  Medicines Take over-the-counter and prescription medicines only as told by your health care provider. If you were prescribed an antibiotic medicine, take it as told by your health care provider. Do not stop using the antibiotic even if you start to feel better. General instructions Make sure you: Empty your bladder often and completely. Do not hold urine for long periods of time. Empty your bladder after sex. Wipe from front to back after urinating or having a bowel movement if you are female. Use each tissue only one time when you wipe. Drink enough fluid to keep your urine pale yellow. Keep all follow-up visits. This is important. Contact a health   care provider if: Your symptoms do not get better after 1-2 days. Your symptoms go away and then return. Get help right away if: You have severe pain in  your back or your lower abdomen. You have a fever or chills. You have nausea or vomiting. Summary A urinary tract infection (UTI) is an infection of any part of the urinary tract, which includes the kidneys, ureters, bladder, and urethra. Most urinary tract infections are caused by bacteria in your genital area. Treatment for this condition often includes antibiotic medicines. If you were prescribed an antibiotic medicine, take it as told by your health care provider. Do not stop using the antibiotic even if you start to feel better. Keep all follow-up visits. This is important. This information is not intended to replace advice given to you by your health care provider. Make sure you discuss any questions you have with your health care provider. Document Revised: 02/10/2020 Document Reviewed: 02/10/2020 Elsevier Patient Education  2023 Elsevier Inc.  

## 2022-11-13 LAB — URINALYSIS, ROUTINE W REFLEX MICROSCOPIC
Bilirubin, UA: NEGATIVE
Glucose, UA: NEGATIVE
Nitrite, UA: NEGATIVE
Specific Gravity, UA: 1.03 (ref 1.005–1.030)
Urobilinogen, Ur: 2 mg/dL — ABNORMAL HIGH (ref 0.2–1.0)
pH, UA: 5.5 (ref 5.0–7.5)

## 2022-11-13 LAB — MICROSCOPIC EXAMINATION
Epithelial Cells (non renal): >10 /hpf — AB (ref 0–10)
RBC, Urine: >30 /hpf — AB (ref 0–2)

## 2022-11-14 LAB — URINE CULTURE

## 2022-11-18 ENCOUNTER — Encounter: Payer: Self-pay | Admitting: Urology

## 2022-11-20 ENCOUNTER — Telehealth: Payer: Self-pay

## 2022-11-20 NOTE — Telephone Encounter (Signed)
Patient called in for urine culture results. Patient states that she is feeling a lot of pressure and some body aches. Patient is aware that her urine culture has not resulted yet and once the urine results someone will reach out to her . Patient voiced understanding

## 2022-11-24 ENCOUNTER — Encounter: Payer: Self-pay | Admitting: Urology

## 2023-05-25 ENCOUNTER — Ambulatory Visit: Payer: BC Managed Care – PPO | Admitting: Urology

## 2023-05-25 ENCOUNTER — Encounter: Payer: Self-pay | Admitting: Urology

## 2023-05-25 VITALS — BP 122/80 | HR 87

## 2023-05-25 DIAGNOSIS — Z09 Encounter for follow-up examination after completed treatment for conditions other than malignant neoplasm: Secondary | ICD-10-CM

## 2023-05-25 DIAGNOSIS — Z87440 Personal history of urinary (tract) infections: Secondary | ICD-10-CM

## 2023-05-25 DIAGNOSIS — N390 Urinary tract infection, site not specified: Secondary | ICD-10-CM

## 2023-05-25 MED ORDER — NITROFURANTOIN MACROCRYSTAL 50 MG PO CAPS: 50.0000 mg | ORAL_CAPSULE | ORAL | 11 refills | Status: DC | PRN

## 2023-05-25 NOTE — Progress Notes (Unsigned)
05/25/2023 3:52 PM   Alison Wells [id number] [birth year] [id number]  Referring provider: Shea Evans, MD [address] [place],  [place] [address]  No chief complaint on file.   HPI:    PMH: Past Medical History:  Diagnosis Date   Medical history non-contributory    UTI (urinary tract infection)     Surgical History: Past Surgical History:  Procedure Laterality Date   CESAREAN SECTION N/A 12/09/2014   Procedure: CESAREAN SECTION;  Surgeon: Shea Evans, MD;  Location: [facility];  Service: Obstetrics;  Laterality: N/A;   CESAREAN SECTION N/A 07/11/2018   Procedure: Repeat CESAREAN SECTION;  Surgeon: Shea Evans, MD;  Location: [facility];  Service: Obstetrics;  Laterality: N/A;  EDD: 07/17/18   TONSILLECTOMY      Home Medications:  Allergies as of 05/25/2023   No Known Allergies      Medication List        Accurate as of May 25, 2023  3:52 PM. If you have any questions, ask your nurse or doctor.          carbamide peroxide 6.5 % OTIC solution Commonly known as: DEBROX Place 5 drops into the right ear 2 (two) times daily.   ferrous sulfate 325 (65 FE) MG EC tablet Take 325 mg by mouth daily with breakfast.   fluconazole 150 MG tablet Commonly known as: DIFLUCAN Take 1 tablet (150 mg total) by mouth daily.   fluticasone 50 MCG/ACT nasal spray Commonly known as: FLONASE Place 1 spray into both nostrils 2 (two) times daily.   fluticasone 50 MCG/ACT nasal spray Commonly known as: FLONASE Place 2 sprays into both nostrils daily.   ibuprofen 600 MG tablet Commonly known as: ADVIL Take 1 tablet (600 mg total) by mouth every 6 (six) hours as needed for mild pain or moderate pain.   JUICE PLUS FIBRE PO Take 2 tablets by mouth daily.   nitrofurantoin (macrocrystal-monohydrate) 100 MG capsule Commonly known as: MACROBID Take 1 capsule (100 mg total) by mouth every 12 (twelve) hours.   nitrofurantoin 50 MG capsule Commonly known as: Macrodantin Take  1 capsule (50 mg total) by mouth as needed.   pantoprazole 20 MG tablet Commonly known as: PROTONIX Take 20 mg by mouth daily as needed for heartburn.   phenazopyridine 95 MG tablet Commonly known as: PYRIDIUM Take 95 mg by mouth 3 (three) times daily as needed for pain.   SIMILAC PRENATAL EARLY SHIELD PO Take 1 tablet by mouth daily.        Allergies: No Known Allergies  Family History: Family History  Problem Relation Age of Onset   Hypertension Mother    Diabetes Father    Hypertension Father     Social History:  reports that she has never smoked. She has never used smokeless tobacco. She reports current alcohol use. She reports that she does not use drugs.  ROS: All other review of systems were reviewed and are negative except what is noted above in HPI  Physical Exam: BP 122/80   Pulse 87   Constitutional:  Alert and oriented, No acute distress. HEENT: [place] AT, moist mucus membranes.  Trachea midline, no masses. Cardiovascular: No clubbing, cyanosis, or edema. Respiratory: Normal respiratory effort, no increased work of breathing. GI: Abdomen is soft, nontender, nondistended, no abdominal masses GU: No CVA tenderness.  Lymph: No cervical or inguinal lymphadenopathy. Skin: No rashes, bruises or suspicious lesions. Neurologic: Grossly intact, no focal deficits, moving all 4 extremities. Psychiatric: Normal mood and affect.  Laboratory Data: Lab Results  Component Value Date   WBC 10.4 07/12/2018   HGB 10.3 (L) 07/12/2018   HCT 31.5 (L) 07/12/2018   MCV 90.8 07/12/2018   PLT 217 07/12/2018    Lab Results  Component Value Date   CREATININE 0.64 12/09/2014    No results found for: "PSA"  No results found for: "TESTOSTERONE"  No results found for: "HGBA1C"  Urinalysis    Component Value Date/Time   COLORURINE YELLOW 05/15/2018 1447   APPEARANCEUR Clear 11/12/2022 1548   LABSPEC 1.012 05/15/2018 1447   PHURINE 8.0 05/15/2018 1447   GLUCOSEU  Negative 11/12/2022 1548   HGBUR NEGATIVE 05/15/2018 1447   BILIRUBINUR Negative 11/12/2022 1548   KETONESUR trace (5) (A) 07/31/2022 1230   KETONESUR 5 (A) 05/15/2018 1447   PROTEINUR 1+ (A) 11/12/2022 1548   PROTEINUR 100 (A) 05/15/2018 1447   UROBILINOGEN 1.0 07/31/2022 1230   UROBILINOGEN 0.2 05/23/2013 0722   NITRITE Negative 11/12/2022 1548   NITRITE POSITIVE (A) 05/15/2018 1447   LEUKOCYTESUR 1+ (A) 11/12/2022 1548    Lab Results  Component Value Date   LABMICR See below: 11/12/2022   WBCUA 6-10 (A) 11/12/2022   LABEPIT >10 (A) 11/12/2022   MUCUS Present (A) 09/10/2022   BACTERIA Moderate (A) 11/12/2022    Pertinent Imaging: *** No results found for this or any previous visit.  No results found for this or any previous visit.  No results found for this or any previous visit.  No results found for this or any previous visit.  No results found for this or any previous visit.  No valid procedures specified. No results found for this or any previous visit.  No results found for this or any previous visit.   Assessment & Plan:    1. Recurrent UTI Continue macrobid prophylaxis - Urinalysis, Routine w reflex microscopic   No follow-ups on file.  Wilkie Aye, MD  [facility] [place]

## 2023-05-25 NOTE — Patient Instructions (Signed)
Urinary Tract Infection, Adult  A urinary tract infection (UTI) is an infection of any part of the urinary tract. The urinary tract includes the kidneys, ureters, bladder, and urethra. These organs make, store, and get rid of urine in the body. An upper UTI affects the ureters and kidneys. A lower UTI affects the bladder and urethra. What are the causes? Most urinary tract infections are caused by bacteria in your genital area around your urethra, where urine leaves your body. These bacteria grow and cause inflammation of your urinary tract. What increases the risk? You are more likely to develop this condition if: You have a urinary catheter that stays in place. You are not able to control when you urinate or have a bowel movement (incontinence). You are female and you: Use a spermicide or diaphragm for birth control. Have low estrogen levels. Are pregnant. You have certain genes that increase your risk. You are sexually active. You take antibiotic medicines. You have a condition that causes your flow of urine to slow down, such as: An enlarged prostate, if you are female. Blockage in your urethra. A kidney stone. A nerve condition that affects your bladder control (neurogenic bladder). Not getting enough to drink, or not urinating often. You have certain medical conditions, such as: Diabetes. A weak disease-fighting system (immunesystem). Sickle cell disease. Gout. Spinal cord injury. What are the signs or symptoms? Symptoms of this condition include: Needing to urinate right away (urgency). Frequent urination. This may include small amounts of urine each time you urinate. Pain or burning with urination. Blood in the urine. Urine that smells bad or unusual. Trouble urinating. Cloudy urine. Vaginal discharge, if you are female. Pain in the abdomen or the lower back. You may also have: Vomiting or a decreased appetite. Confusion. Irritability or tiredness. A fever or  chills. Diarrhea. The first symptom in older adults may be confusion. In some cases, they may not have any symptoms until the infection has worsened. How is this diagnosed? This condition is diagnosed based on your medical history and a physical exam. You may also have other tests, including: Urine tests. Blood tests. Tests for STIs (sexually transmitted infections). If you have had more than one UTI, a cystoscopy or imaging studies may be done to determine the cause of the infections. How is this treated? Treatment for this condition includes: Antibiotic medicine. Over-the-counter medicines to treat discomfort. Drinking enough water to stay hydrated. If you have frequent infections or have other conditions such as a kidney stone, you may need to see a health care provider who specializes in the urinary tract (urologist). In rare cases, urinary tract infections can cause sepsis. Sepsis is a life-threatening condition that occurs when the body responds to an infection. Sepsis is treated in the hospital with IV antibiotics, fluids, and other medicines. Follow these instructions at home:  Medicines Take over-the-counter and prescription medicines only as told by your health care provider. If you were prescribed an antibiotic medicine, take it as told by your health care provider. Do not stop using the antibiotic even if you start to feel better. General instructions Make sure you: Empty your bladder often and completely. Do not hold urine for long periods of time. Empty your bladder after sex. Wipe from front to back after urinating or having a bowel movement if you are female. Use each tissue only one time when you wipe. Drink enough fluid to keep your urine pale yellow. Keep all follow-up visits. This is important. Contact a health   care provider if: Your symptoms do not get better after 1-2 days. Your symptoms go away and then return. Get help right away if: You have severe pain in  your back or your lower abdomen. You have a fever or chills. You have nausea or vomiting. Summary A urinary tract infection (UTI) is an infection of any part of the urinary tract, which includes the kidneys, ureters, bladder, and urethra. Most urinary tract infections are caused by bacteria in your genital area. Treatment for this condition often includes antibiotic medicines. If you were prescribed an antibiotic medicine, take it as told by your health care provider. Do not stop using the antibiotic even if you start to feel better. Keep all follow-up visits. This is important. This information is not intended to replace advice given to you by your health care provider. Make sure you discuss any questions you have with your health care provider. Document Revised: 02/05/2020 Document Reviewed: 02/10/2020 Elsevier Patient Education  2024 [employer]vier Inc.  

## 2023-05-26 LAB — MICROSCOPIC EXAMINATION: Epithelial Cells (non renal): >10 /[HPF] — AB (ref 0–10)

## 2023-05-26 LAB — URINALYSIS, ROUTINE W REFLEX MICROSCOPIC
Bilirubin, UA: NEGATIVE
Glucose, UA: NEGATIVE
Ketones, UA: NEGATIVE
Nitrite, UA: NEGATIVE
Protein,UA: NEGATIVE
Specific Gravity, UA: 1.01 (ref 1.005–1.030)
Urobilinogen, Ur: 1 mg/dL (ref 0.2–1.0)
pH, UA: 6 (ref 5.0–7.5)

## 2023-08-11 ENCOUNTER — Ambulatory Visit
Admission: EM | Admit: 2023-08-11 | Discharge: 2023-08-11 | Disposition: A | Discharge status: Home / Self Care | Payer: 59 | Attending: Nurse Practitioner | Admitting: Nurse Practitioner

## 2023-08-11 DIAGNOSIS — J069 Acute upper respiratory infection, unspecified: Secondary | ICD-10-CM

## 2023-08-11 DIAGNOSIS — R059 Cough, unspecified: Secondary | ICD-10-CM

## 2023-08-11 DIAGNOSIS — J040 Acute laryngitis: Secondary | ICD-10-CM

## 2023-08-11 LAB — POCT INFLUENZA A/B
Influenza A, POC: NEGATIVE
Influenza B, POC: NEGATIVE

## 2023-08-11 MED ORDER — PSEUDOEPH-BROMPHEN-DM 30-2-10 MG/5ML PO SYRP: 5.0000 mL | ORAL_SOLUTION | Freq: Four times a day (QID) | ORAL | 0 refills | Status: DC | PRN

## 2023-08-11 MED ORDER — FLUTICASONE PROPIONATE 50 MCG/ACT NA SUSP: 2.0000 | Freq: Every day | NASAL | 0 refills | Status: DC

## 2023-08-11 MED ORDER — CETIRIZINE HCL 10 MG PO TABS: 10.0000 mg | ORAL_TABLET | Freq: Every day | ORAL | 0 refills | Status: AC

## 2023-08-11 NOTE — ED Triage Notes (Signed)
Pt reports she lost her voice and has a dry cough x 3 days

## 2023-08-11 NOTE — ED Provider Notes (Signed)
[facility]    CSN: [id number] Arrival date & time: 08/11/23  1724      History   Chief Complaint No chief complaint on file.   HPI Alison Wells is a 33 y.o. female.   The history is provided by the patient.   Patient presents for complaints of hoarseness and a dry cough that is been present for the past 3 days.  Patient denies fever, chills, headache, sore throat, wheezing, difficulty breathing, abdominal pain, nausea, vomiting, diarrhea, or rash.  Patient also endorses nasal congestion and postnasal drainage.  States she has been using normal saline nasal spray in the mornings.  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  Past Medical History:  Diagnosis Date   Medical history non-contributory    UTI (urinary tract infection)     Patient Active Problem List   Diagnosis Date Noted   Previous cesarean section 07/11/2018   Postpartum care following cesarean delivery (12/29) 07/11/2018   Status post repeat low transverse cesarean section 07/11/2018   Pyelonephritis affecting pregnancy 05/15/2018    Past Surgical History:  Procedure Laterality Date   CESAREAN SECTION N/A 12/09/2014   Procedure: CESAREAN SECTION;  Surgeon: Shea Evans, MD;  Location: [facility];  Service: Obstetrics;  Laterality: N/A;   CESAREAN SECTION N/A 07/11/2018   Procedure: Repeat CESAREAN SECTION;  Surgeon: Shea Evans, MD;  Location: [facility];  Service: Obstetrics;  Laterality: N/A;  EDD: 07/17/18   TONSILLECTOMY      OB History     Gravida  2   Para  2   Term  2   Preterm      AB      Living  2      SAB      IAB      Ectopic      Multiple  0   Live Births  2            Home Medications    Prior to Admission medications   Medication Sig Start Date End Date Taking? Authorizing Provider  brompheniramine-pseudoephedrine-DM 30-2-10 MG/5ML syrup Take 5 mLs by mouth 4 (four) times daily as needed. 08/11/23  Yes Leath-Warren, Sadie Haber, NP   cetirizine (ZYRTEC) 10 MG tablet Take 1 tablet (10 mg total) by mouth daily. 08/11/23  Yes Leath-Warren, Sadie Haber, NP  fluticasone (FLONASE) 50 MCG/ACT nasal spray Place 2 sprays into both nostrils daily. 08/11/23  Yes Leath-Warren, Sadie Haber, NP  carbamide peroxide (DEBROX) 6.5 % OTIC solution Place 5 drops into the right ear 2 (two) times daily. 02/04/22   Particia Nearing, PA-C  ferrous sulfate 325 (65 FE) MG EC tablet Take 325 mg by mouth daily with breakfast.    [provider]  fluconazole (DIFLUCAN) 150 MG tablet Take 1 tablet (150 mg total) by mouth daily. 09/10/22   McKenzie, Mardene Celeste, MD  ibuprofen (ADVIL,MOTRIN) 600 MG tablet Take 1 tablet (600 mg total) by mouth every 6 (six) hours as needed for mild pain or moderate pain. 07/13/18   Sigmon, Scarlette Slice, CNM  nitrofurantoin (MACRODANTIN) 50 MG capsule Take 1 capsule (50 mg total) by mouth as needed. 05/25/23   McKenzie, Mardene Celeste, MD  Nutritional Supplements (JUICE PLUS FIBRE PO) Take 2 tablets by mouth daily.    [provider]  pantoprazole (PROTONIX) 20 MG tablet Take 20 mg by mouth daily as needed for heartburn.  04/26/18   [provider]  phenazopyridine (PYRIDIUM) 95 MG  tablet Take 95 mg by mouth 3 (three) times daily as needed for pain.    [provider]  Prenatal MV-Min-Fe Fum-FA-DHA [facility] PRENATAL EARLY SHIELD PO) Take 1 tablet by mouth daily.    [provider]    Family History Family History  Problem Relation Age of Onset   Hypertension Mother    Diabetes Father    Hypertension Father     Social History Social History   Tobacco Use   Smoking status: Never   Smokeless tobacco: Never  Vaping Use   Vaping status: Never Used  Substance Use Topics   Alcohol use: Yes    Comment: Occas   Drug use: No     Allergies   Patient has no known allergies.   Review of Systems Review of Systems Per HPI  Physical Exam Triage Vital Signs ED Triage Vitals  [08/11/23 1733]  Encounter Vitals Group     BP 124/82     Systolic BP Percentile      Diastolic BP Percentile      Pulse Rate 96     Resp 16     Temp 97.7 F (36.5 C)     Temp Source Oral     SpO2 97 %     Weight      Height      Head Circumference      Peak Flow      Pain Score 0     Pain Loc      Pain Education      Exclude from Growth Chart    No data found.  Updated Vital Signs BP 124/82 (BP Location: Right Arm)   Pulse 96   Temp 97.7 F (36.5 C) (Oral)   Resp 16   LMP 08/10/2023 Comment: start  SpO2 97%   Visual Acuity Right Eye Distance:   Left Eye Distance:   Bilateral Distance:    Right Eye Near:   Left Eye Near:    Bilateral Near:     Physical Exam Vitals and nursing note reviewed.  Constitutional:      General: She is not in acute distress.    Appearance: Normal appearance.  HENT:     Head: Normocephalic.     Right Ear: Tympanic membrane, ear canal and external ear normal.     Left Ear: Tympanic membrane, ear canal and external ear normal.     Nose: Congestion present.     Right Turbinates: Enlarged and swollen.     Left Turbinates: Enlarged and swollen.     Right Sinus: No maxillary sinus tenderness or frontal sinus tenderness.     Left Sinus: No maxillary sinus tenderness or frontal sinus tenderness.     Mouth/Throat:     Lips: Pink.     Mouth: Mucous membranes are moist.     Pharynx: Uvula midline. Postnasal drip present. No pharyngeal swelling, oropharyngeal exudate, posterior oropharyngeal erythema or uvula swelling.     Comments: Cobblestoning present to posterior oropharynx  Eyes:     Extraocular Movements: Extraocular movements intact.     Conjunctiva/sclera: Conjunctivae normal.     Pupils: Pupils are equal, round, and reactive to light.  Cardiovascular:     Rate and Rhythm: Normal rate and regular rhythm.     Pulses: Normal pulses.     Heart sounds: Normal heart sounds.  Pulmonary:     Effort: Pulmonary effort is normal. No  respiratory distress.     Breath sounds: Normal breath sounds. No stridor.  No wheezing, rhonchi or rales.  Abdominal:     General: Bowel sounds are normal.     Palpations: Abdomen is soft.     Tenderness: There is no abdominal tenderness.  Musculoskeletal:     Cervical back: Normal range of motion.  Lymphadenopathy:     Cervical: No cervical adenopathy.  Skin:    General: Skin is warm and dry.  Neurological:     General: No focal deficit present.     Mental Status: She is alert and oriented to person, place, and time.  Psychiatric:        Mood and Affect: Mood normal.        Behavior: Behavior normal.      UC Treatments / Results  Labs (all labs ordered are listed, but only abnormal results are displayed) Labs Reviewed  POCT INFLUENZA A/B    EKG   Radiology No results found.  Procedures Procedures (including critical care time)  Medications Ordered in UC Medications - No data to display  Initial Impression / Assessment and Plan / UC Course  I have reviewed the triage vital signs and the nursing notes.  Pertinent labs & imaging results that were available during my care of the patient were reviewed by me and considered in my medical decision making (see chart for details).  On exam, lung sounds are clear throughout, room air sats at 97%.  Influenza test was negative.  Symptoms most likely of viral etiology.  Will start patient on Bromfed-DM for cough and postnasal drainage, fluticasone 50 mcg nasal spray for nasal congestion and runny nose and cetirizine 10 mg as an antihistamine.  Supportive care recommendations were provided and discussed with the patient to include fluids, rest, over-the-counter analgesics, along with warm salt water gargles, and voice rest.  Discussed indications with the patient regarding follow-up.  Patient was in agreement with this plan of care and verbalizes understanding.  All questions were answered.  Patient stable for discharge. Final  Clinical Impressions(s) / UC Diagnoses   Final diagnoses:  Cough, unspecified type  Viral URI with cough  Laryngitis     Discharge Instructions      Influenza test was negative. Take medication as prescribed. Increase fluids and allow for plenty of rest. May take over-the-counter Tylenol or ibuprofen as needed for pain, fever, or general discomfort. Warm salt water gargles 3-4 times daily as needed for throat pain or discomfort. To help with your hoarseness, recommend voice rest as much as possible. Recommend use of a humidifier in your bedroom at nighttime during sleep and sleeping elevated on pillows while cough symptoms persist. Symptoms should improve over the next 5 to 7 days.  If symptoms fail to improve, or appear to be worsening, you may follow-up in this clinic or with your primary care physician for further evaluation. Follow-up as needed.     ED Prescriptions     Medication Sig Dispense Auth. Provider   brompheniramine-pseudoephedrine-DM 30-2-10 MG/5ML syrup Take 5 mLs by mouth 4 (four) times daily as needed. 140 mL Leath-Warren, Sadie Haber, NP   fluticasone (FLONASE) 50 MCG/ACT nasal spray Place 2 sprays into both nostrils daily. 16 g Leath-Warren, Sadie Haber, NP   cetirizine (ZYRTEC) 10 MG tablet Take 1 tablet (10 mg total) by mouth daily. 30 tablet Leath-Warren, Sadie Haber, NP      PDMP not reviewed this encounter.   Abran Cantor, NP 08/11/23 1756

## 2023-08-11 NOTE — Discharge Instructions (Addendum)
Influenza test was negative. Take medication as prescribed. Increase fluids and allow for plenty of rest. May take over-the-counter Tylenol or ibuprofen as needed for pain, fever, or general discomfort. Warm salt water gargles 3-4 times daily as needed for throat pain or discomfort. To help with your hoarseness, recommend voice rest as much as possible. Recommend use of a humidifier in your bedroom at nighttime during sleep and sleeping elevated on pillows while cough symptoms persist. Symptoms should improve over the next 5 to 7 days.  If symptoms fail to improve, or appear to be worsening, you may follow-up in this clinic or with your primary care physician for further evaluation. Follow-up as needed.

## 2023-09-04 ENCOUNTER — Ambulatory Visit
Admission: EM | Admit: 2023-09-04 | Discharge: 2023-09-04 | Disposition: A | Discharge status: Home / Self Care | Payer: 59 | Attending: Family Medicine | Admitting: Family Medicine

## 2023-09-04 ENCOUNTER — Encounter: Payer: Self-pay | Admitting: Emergency Medicine

## 2023-09-04 DIAGNOSIS — R000 Tachycardia, unspecified: Secondary | ICD-10-CM | POA: Diagnosis not present

## 2023-09-04 DIAGNOSIS — J069 Acute upper respiratory infection, unspecified: Secondary | ICD-10-CM

## 2023-09-04 DIAGNOSIS — R509 Fever, unspecified: Secondary | ICD-10-CM | POA: Diagnosis not present

## 2023-09-04 LAB — POC COVID19/FLU A&B COMBO
Covid Antigen, POC: NEGATIVE
Influenza A Antigen, POC: NEGATIVE
Influenza B Antigen, POC: NEGATIVE

## 2023-09-04 MED ORDER — OSELTAMIVIR PHOSPHATE 75 MG PO CAPS: 75.0000 mg | ORAL_CAPSULE | Freq: Two times a day (BID) | ORAL | 0 refills | Status: AC

## 2023-09-04 MED ORDER — ACETAMINOPHEN 325 MG PO TABS
650.0000 mg | ORAL_TABLET | Freq: Once | ORAL | Status: AC
  Administered 2023-09-04: 650 mg via ORAL

## 2023-09-04 NOTE — ED Provider Notes (Signed)
[facility]    CSN: [id number] Arrival date & time: 09/04/23  1638      History   Chief Complaint No chief complaint on file.   HPI Alison Wells is a 33 y.o. female.   Patient presenting today with 1 day history of cough, fever, body aches, headache, congestion, sore throat.  Denies chest pain, shortness of breath, abdominal pain, nausea vomiting or diarrhea.  So far trying DayQuil and NyQuil with minimal relief.  Multiple sick contacts recently.    Past Medical History:  Diagnosis Date   Medical history non-contributory    UTI (urinary tract infection)     Patient Active Problem List   Diagnosis Date Noted   Previous cesarean section 07/11/2018   Postpartum care following cesarean delivery (12/29) 07/11/2018   Status post repeat low transverse cesarean section 07/11/2018   Pyelonephritis affecting pregnancy 05/15/2018    Past Surgical History:  Procedure Laterality Date   CESAREAN SECTION N/A 12/09/2014   Procedure: CESAREAN SECTION;  Surgeon: Shea Evans, MD;  Location: [facility];  Service: Obstetrics;  Laterality: N/A;   CESAREAN SECTION N/A 07/11/2018   Procedure: Repeat CESAREAN SECTION;  Surgeon: Shea Evans, MD;  Location: [facility];  Service: Obstetrics;  Laterality: N/A;  EDD: 07/17/18   TONSILLECTOMY      OB History     Gravida  2   Para  2   Term  2   Preterm      AB      Living  2      SAB      IAB      Ectopic      Multiple  0   Live Births  2            Home Medications    Prior to Admission medications   Medication Sig Start Date End Date Taking? Authorizing Provider  oseltamivir (TAMIFLU) 75 MG capsule Take 1 capsule (75 mg total) by mouth every 12 (twelve) hours. 09/04/23  Yes Particia Nearing, PA-C  cetirizine (ZYRTEC) 10 MG tablet Take 1 tablet (10 mg total) by mouth daily. 08/11/23   Leath-Warren, Sadie Haber, NP    Family History Family History  Problem Relation Age of Onset    Hypertension Mother    Diabetes Father    Hypertension Father     Social History Social History   Tobacco Use   Smoking status: Never   Smokeless tobacco: Never  Vaping Use   Vaping status: Never Used  Substance Use Topics   Alcohol use: Yes    Comment: Occas   Drug use: No     Allergies   Patient has no known allergies.   Review of Systems Review of Systems PER HPI  Physical Exam Triage Vital Signs ED Triage Vitals  Encounter Vitals Group     BP 09/04/23 1659 123/86     Systolic BP Percentile --      Diastolic BP Percentile --      Pulse Rate 09/04/23 1659 (!) 127     Resp 09/04/23 1659 18     Temp 09/04/23 1659 (!) 100.9 F (38.3 C)     Temp Source 09/04/23 1659 Oral     SpO2 09/04/23 1659 95 %     Weight --      Height --      Head Circumference --      Peak Flow --      Pain Score 09/04/23 1701 2  Pain Loc --      Pain Education --      Exclude from Growth Chart --    No data found.  Updated Vital Signs BP 123/86 (BP Location: Right Arm)   Pulse (!) 127   Temp (!) 100.9 F (38.3 C) (Oral)   Resp 18   LMP 09/03/2023 (Exact Date) Comment: start  SpO2 95%   Visual Acuity Right Eye Distance:   Left Eye Distance:   Bilateral Distance:    Right Eye Near:   Left Eye Near:    Bilateral Near:     Physical Exam Vitals and nursing note reviewed.  Constitutional:      Appearance: Normal appearance.  HENT:     Head: Atraumatic.     Right Ear: Tympanic membrane and external ear normal.     Left Ear: Tympanic membrane and external ear normal.     Nose: Rhinorrhea present.     Mouth/Throat:     Mouth: Mucous membranes are moist.     Pharynx: Posterior oropharyngeal erythema present.  Eyes:     Extraocular Movements: Extraocular movements intact.     Conjunctiva/sclera: Conjunctivae normal.  Cardiovascular:     Rate and Rhythm: Normal rate and regular rhythm.     Heart sounds: Normal heart sounds.  Pulmonary:     Effort: Pulmonary effort  is normal.     Breath sounds: Normal breath sounds. No wheezing or rales.  Musculoskeletal:        General: Normal range of motion.     Cervical back: Normal range of motion and neck supple.  Skin:    General: Skin is warm and dry.  Neurological:     Mental Status: She is alert and oriented to person, place, and time.  Psychiatric:        Mood and Affect: Mood normal.        Thought Content: Thought content normal.      UC Treatments / Results  Labs (all labs ordered are listed, but only abnormal results are displayed) Labs Reviewed  POC COVID19/FLU A&B COMBO    EKG   Radiology No results found.  Procedures Procedures (including critical care time)  Medications Ordered in UC Medications  acetaminophen (TYLENOL) tablet 650 mg (650 mg Oral Given 09/04/23 1704)    Initial Impression / Assessment and Plan / UC Course  I have reviewed the triage vital signs and the nursing notes.  Pertinent labs & imaging results that were available during my care of the patient were reviewed by me and considered in my medical decision making (see chart for details).     Febrile and tachycardic in triage likely secondary to viral illness.  Rapid flu and COVID-negative but flulike symptoms.  Tylenol was given in triage, will treat with Tamiflu, supportive over-the-counter medications and home care.  Return for worsening symptoms.  Work note given.  Final Clinical Impressions(s) / UC Diagnoses   Final diagnoses:  Viral URI with cough  Fever, unspecified  Tachycardia   Discharge Instructions   None    ED Prescriptions     Medication Sig Dispense Auth. Provider   oseltamivir (TAMIFLU) 75 MG capsule Take 1 capsule (75 mg total) by mouth every 12 (twelve) hours. 10 capsule Particia Nearing, [place]      PDMP not reviewed this encounter.   Particia Nearing, [place] 09/04/23 1736

## 2023-09-04 NOTE — ED Triage Notes (Signed)
Cough, fever, body aches and headache since yesterday.  Has been taking dayquil and nyquil

## 2024-04-12 ENCOUNTER — Other Ambulatory Visit

## 2024-04-12 ENCOUNTER — Other Ambulatory Visit: Payer: Self-pay

## 2024-04-12 ENCOUNTER — Telehealth: Payer: Self-pay

## 2024-04-12 DIAGNOSIS — N390 Urinary tract infection, site not specified: Secondary | ICD-10-CM

## 2024-04-12 NOTE — Telephone Encounter (Signed)
 Urologic History:  Any Recent Urologic Surgeries or Procedures:no Recurrent UTI's:yes Cystitis: no  Prostatitis:no Kidney or Bladder Stones: no Plan: Walk-in Clinic: no Appointment w/Physician: [no Lab visit scheduled for urine drop off: Yes Advice given:  Do you take on daily medications for UTI suppression Yes  Patient states she continues to take Macrobid  prophylaxis

## 2024-04-12 NOTE — Telephone Encounter (Signed)
 Dysuria  Patient called with c/o dysuria x 2-3 days days.  Pain: pressure  Severity:4/10  Associated Signs and Symptoms:  Fever: noTemp. Chills: no Hematuria: no Urgency: no Frequency: no Hesitancy:no Incontinence: no Nausea: no Vomiting: yes  Message sent to clinic staff to return call to patient to advise of plan.   Call:  [phone] (H)

## 2024-04-12 NOTE — Telephone Encounter (Signed)
 Open in error

## 2024-04-13 LAB — MICROSCOPIC EXAMINATION: RBC, Urine: >30 /HPF — AB (ref 0–2)

## 2024-04-13 LAB — URINALYSIS, ROUTINE W REFLEX MICROSCOPIC
Bilirubin, UA: NEGATIVE
Glucose, UA: NEGATIVE
Nitrite, UA: NEGATIVE
Specific Gravity, UA: 1.025 (ref 1.005–1.030)
Urobilinogen, Ur: 1 mg/dL (ref 0.2–1.0)
pH, UA: 6 (ref 5.0–7.5)

## 2024-04-13 MED ORDER — SULFAMETHOXAZOLE-TRIMETHOPRIM 800-160 MG PO TABS: 1.0000 | ORAL_TABLET | Freq: Two times a day (BID) | ORAL | 0 refills | Status: AC

## 2024-04-13 NOTE — Telephone Encounter (Signed)
 Patient made aware of Bactrim  sent to pharmacy due to urinalysis. Urine culture pending.

## 2024-04-13 NOTE — Addendum Note (Signed)
 Addended by: MALACHY SLICE on: 04/13/2024 10:32 AM   Modules accepted: Orders

## 2024-04-14 LAB — URINE CULTURE

## 2024-06-08 ENCOUNTER — Ambulatory Visit: Payer: BC Managed Care – PPO | Admitting: Urology

## 2024-06-08 VITALS — BP 106/69 | HR 73

## 2024-06-08 DIAGNOSIS — Z87440 Personal history of urinary (tract) infections: Secondary | ICD-10-CM

## 2024-06-08 DIAGNOSIS — Z09 Encounter for follow-up examination after completed treatment for conditions other than malignant neoplasm: Secondary | ICD-10-CM

## 2024-06-08 DIAGNOSIS — N390 Urinary tract infection, site not specified: Secondary | ICD-10-CM

## 2024-06-08 LAB — URINALYSIS, ROUTINE W REFLEX MICROSCOPIC
Bilirubin, UA: NEGATIVE
Glucose, UA: NEGATIVE
Ketones, UA: NEGATIVE
Leukocytes,UA: NEGATIVE
Nitrite, UA: NEGATIVE
Protein,UA: NEGATIVE
RBC, UA: NEGATIVE
Specific Gravity, UA: 1.015 (ref 1.005–1.030)
Urobilinogen, Ur: 2 mg/dL — ABNORMAL HIGH (ref 0.2–1.0)
pH, UA: 8.5 — ABNORMAL HIGH (ref 5.0–7.5)

## 2024-06-08 MED ORDER — CRANBERRY 250 MG PO CAPS: 1.0000 | ORAL_CAPSULE | Freq: Every day | ORAL | 3 refills | Status: AC

## 2024-06-08 MED ORDER — D-MANNOSE 500 MG PO CAPS: 1.0000 | ORAL_CAPSULE | Freq: Every day | ORAL | 11 refills | Status: AC

## 2024-06-08 MED ORDER — NITROFURANTOIN MONOHYD MACRO 100 MG PO CAPS: 100.0000 mg | ORAL_CAPSULE | ORAL | 3 refills | Status: AC | PRN

## 2024-06-08 NOTE — Progress Notes (Signed)
 06/08/2024 9:09 AM   Alison Wells 1991/04/17 [id number]  Referring provider: Barbette Knock, MD [address] [place],  [place] [address]  Followup frequent UTI   HPI: Alison Wells is a 33yo here for followup for frequent UTIs. She got 3 UTIs in the past year. She uses macrobid  100mg  post coital. She was last treated with bactrim . She has found she gets UTIs when she does not drink as much water and decreases her urinary frequency to 5-6 hours.    PMH: Past Medical History:  Diagnosis Date   Medical history non-contributory    UTI (urinary tract infection)     Surgical History: Past Surgical History:  Procedure Laterality Date   CESAREAN SECTION N/A 12/09/2014   Procedure: CESAREAN SECTION;  Surgeon: Knock Barbette, MD;  Location: [facility];  Service: Obstetrics;  Laterality: N/A;   CESAREAN SECTION N/A 07/11/2018   Procedure: Repeat CESAREAN SECTION;  Surgeon: Barbette Knock, MD;  Location: [facility];  Service: Obstetrics;  Laterality: N/A;  EDD: 07/17/18   TONSILLECTOMY      Home Medications:  Allergies as of 06/08/2024   No Known Allergies      Medication List        Accurate as of June 08, 2024  9:09 AM. If you have any questions, ask your nurse or doctor.          cetirizine  10 MG tablet Commonly known as: ZYRTEC  Take 1 tablet (10 mg total) by mouth daily.   oseltamivir  75 MG capsule Commonly known as: TAMIFLU  Take 1 capsule (75 mg total) by mouth every 12 (twelve) hours.   sulfamethoxazole -trimethoprim  800-160 MG tablet Commonly known as: BACTRIM  DS Take 1 tablet by mouth 2 (two) times daily.        Allergies: No Known Allergies  Family History: Family History  Problem Relation Age of Onset   Hypertension Mother    Diabetes Father    Hypertension Father     Social History:  reports that she has never smoked. She has never used smokeless tobacco. She reports current alcohol use. She reports that she does not use drugs.  ROS: All  other review of systems were reviewed and are negative except what is noted above in HPI  Physical Exam: BP 106/69   Pulse 73   Constitutional:  Alert and oriented, No acute distress. HEENT: Alison Wells AT, moist mucus membranes.  Trachea midline, no masses. Cardiovascular: No clubbing, cyanosis, or edema. Respiratory: Normal respiratory effort, no increased work of breathing. GI: Abdomen is soft, nontender, nondistended, no abdominal masses GU: No CVA tenderness.  Lymph: No cervical or inguinal lymphadenopathy. Skin: No rashes, bruises or suspicious lesions. Neurologic: Grossly intact, no focal deficits, moving all 4 extremities. Psychiatric: Normal mood and affect.  Laboratory Data: Lab Results  Component Value Date   WBC 10.4 07/12/2018   HGB 10.3 (L) 07/12/2018   HCT 31.5 (L) 07/12/2018   MCV 90.8 07/12/2018   PLT 217 07/12/2018    Lab Results  Component Value Date   CREATININE 0.64 12/09/2014    No results found for: PSA  No results found for: TESTOSTERONE  No results found for: HGBA1C  Urinalysis    Component Value Date/Time   COLORURINE YELLOW 05/15/2018 1447   APPEARANCEUR Clear 04/12/2024 1641   LABSPEC 1.012 05/15/2018 1447   PHURINE 8.0 05/15/2018 1447   GLUCOSEU Negative 04/12/2024 1641   HGBUR NEGATIVE 05/15/2018 1447   BILIRUBINUR Negative 04/12/2024 1641   KETONESUR trace (5) (A) 07/31/2022 1230  KETONESUR 5 (A) 05/15/2018 1447   PROTEINUR Trace (A) 04/12/2024 1641   PROTEINUR 100 (A) 05/15/2018 1447   UROBILINOGEN 1.0 07/31/2022 1230   UROBILINOGEN 0.2 05/23/2013 0722   NITRITE Negative 04/12/2024 1641   NITRITE POSITIVE (A) 05/15/2018 1447   LEUKOCYTESUR Trace (A) 04/12/2024 1641    Lab Results  Component Value Date   LABMICR See below: 04/12/2024   WBCUA 6-10 (A) 04/12/2024   LABEPIT 0-10 04/12/2024   MUCUS Present (A) 09/10/2022   BACTERIA Few (A) 04/12/2024    Pertinent Imaging:  No results found for this or any previous  visit.  No results found for this or any previous visit.  No results found for this or any previous visit.  No results found for this or any previous visit.  No results found for this or any previous visit.  No results found for this or any previous visit.  No results found for this or any previous visit.  No results found for this or any previous visit.   Assessment & Plan:    1. Recurrent UTI (Primary) -Cranberry 50mg  daily and D- mannose - Urinalysis, Routine w reflex microscopic   No follow-ups on file.  Belvie Clara, MD  [facility] [place]

## 2024-06-14 ENCOUNTER — Encounter: Payer: Self-pay | Admitting: Urology

## 2024-06-14 NOTE — Patient Instructions (Signed)
 Pregnancy and Urinary Tract Infection  A urinary tract infection (UTI) is an infection of any part of the urinary tract. This includes the kidneys, the tubes that connect the kidneys to the bladder (ureters), the bladder, and the tube that carries urine out of the body (urethra). These organs make, store, and get rid of urine in the body. Your health care provider may use other names to describe the infection. An upper UTI affects the ureters and kidneys (pyelonephritis). A lower UTI affects the bladder (cystitis) and urethra (urethritis). Most UTIs are caused by bacteria in the genital area, around the entrance to the urinary tract. These bacteria grow and cause irritation and inflammation of the urinary tract. You are more likely to develop a UTI during pregnancy because: The physical and hormonal changes that your body goes through make it easier for bacteria to get into your urinary tract. Your growing baby puts pressure on your bladder and can affect urine flow. Pregnant women with diabetes are at an increased risk for developing a UTI. It is important to recognize and treat UTIs in pregnancy because they can cause serious complications for both you and your baby. How does this affect me? Symptoms of a UTI include: Needing to urinate right away (urgently) and often, even if urinating a small amount. Pain, burning, or having a hard time passing urine. Blood in the urine. Unusual, cloudy, and bad-smelling urine. Pain in the abdomen or lower back. Vaginal discharge. You may also have: Vomiting or a decreased appetite. Confusion. Irritability or tiredness. A fever. Diarrhea. A low level of red blood cells (anemia). The development of high blood pressure during pregnancy (preeclampsia). How does this affect my baby? An untreated UTI during pregnancy could lead to a kidney infection or an infection throughout the mother's body (systemic infection). This can cause health problems and affect the  baby. Possible complications of an untreated UTI include: Your baby being born before 37 weeks of pregnancy (premature). Your baby being born with a low birth weight. Your baby having a higher risk of having his or her skin or the white parts of the eyes turn yellow (jaundice). What can I do to lower my risk? To prevent a UTI: Do not hold urine for long periods of time. Empty your bladder as soon as you feel the urge. Always wipe from front to back, especially after a bowel movement. Use each tissue one time when you wipe. Empty your bladder after sex. Keep your genital area dry. Drink 6 to 8 glasses of water each day. Do not douche or use deodorant sprays. Wear cotton underwear and loose clothing. How is this treated? Treatment for this condition may include: Antibiotic medicines that are safe to take during pregnancy. Other medicines to treat less common causes of UTI. Follow these instructions at home: If you were prescribed an antibiotic medicine, take it as told by your health care provider. Do not stop using the antibiotic even if you start to feel better. Keep all follow-up visits. This is important. Contact a health care provider if: Your symptoms do not improve or they get worse. You have abnormal vaginal discharge. Get help right away if you: Have a fever. Have nausea and vomiting. Have back or side pain. Have lower belly pain, tightness, or feel contractions in your uterus. Have a gush of fluid from your vagina. Have blood in your urine. Summary A UTI is an infection of any part of the urinary tract, which includes the kidneys, ureters, bladder,  and urethra. Most urinary tract infections are caused by bacteria in your genital area, around the entrance to your urinary tract (urethra). You are more likely to develop a UTI during pregnancy. It is important to recognize and treat UTIs in pregnancy because of the risk of serious complications for both you and your baby. If you  were prescribed an antibiotic medicine, take it as told by your health care provider. Do not stop using the antibiotic even if you start to feel better. This information is not intended to replace advice given to you by your health care provider. Make sure you discuss any questions you have with your health care provider. Document Revised: 02/13/2021 Document Reviewed: 02/13/2021 Elsevier Patient Education  2024 [employer].

## 2025-06-07 ENCOUNTER — Ambulatory Visit: Admitting: Urology
